# Patient Record
Sex: Male | Born: 1990 | Race: White | Hispanic: No | Marital: Single | Smoking: Current every day smoker
Health system: Southern US, Community
[De-identification: ages and names within clinical notes are randomized; demographics above are authoritative.]

## PROBLEM LIST (undated history)

## (undated) DIAGNOSIS — F1910 Other psychoactive substance abuse, uncomplicated: Secondary | ICD-10-CM

## (undated) DIAGNOSIS — G039 Meningitis, unspecified: Secondary | ICD-10-CM

## (undated) HISTORY — PX: LUMBAR PUNCTURE: SHX1985

---
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
## 2011-11-22 ENCOUNTER — Encounter (HOSPITAL_COMMUNITY): Payer: Self-pay | Admitting: *Deleted

## 2011-11-22 ENCOUNTER — Emergency Department (HOSPITAL_COMMUNITY)
Admission: EM | Admit: 2011-11-22 | Discharge: 2011-11-22 | Disposition: A | Discharge status: Home / Self Care | Payer: Self-pay | Attending: Emergency Medicine | Admitting: Emergency Medicine

## 2011-11-22 DIAGNOSIS — F17200 Nicotine dependence, unspecified, uncomplicated: Secondary | ICD-10-CM | POA: Insufficient documentation

## 2011-11-22 DIAGNOSIS — T622X1A Toxic effect of other ingested (parts of) plant(s), accidental (unintentional), initial encounter: Secondary | ICD-10-CM | POA: Insufficient documentation

## 2011-11-22 DIAGNOSIS — Z8661 Personal history of infections of the central nervous system: Secondary | ICD-10-CM | POA: Insufficient documentation

## 2011-11-22 DIAGNOSIS — L255 Unspecified contact dermatitis due to plants, except food: Secondary | ICD-10-CM | POA: Insufficient documentation

## 2011-11-22 DIAGNOSIS — L237 Allergic contact dermatitis due to plants, except food: Secondary | ICD-10-CM

## 2011-11-22 MED ORDER — HYDROXYZINE HCL 25 MG PO TABS
50.0000 mg | ORAL_TABLET | Freq: Once | ORAL | Status: AC
  Administered 2011-11-22: 50 mg via ORAL
  Filled 2011-11-22: qty 2

## 2011-11-22 MED ORDER — METHYLPREDNISOLONE SODIUM SUCC 125 MG IJ SOLR
125.0000 mg | Freq: Once | INTRAMUSCULAR | Status: AC
  Administered 2011-11-22: 125 mg via INTRAMUSCULAR
  Filled 2011-11-22: qty 2

## 2011-11-22 MED ORDER — PREDNISONE 10 MG PO TABS: ORAL_TABLET | ORAL | Status: DC

## 2011-11-22 MED ORDER — HYDROXYZINE PAMOATE 25 MG PO CAPS: ORAL_CAPSULE | ORAL | Status: DC

## 2011-11-22 NOTE — ED Notes (Signed)
Poison ivy rash over body

## 2011-11-22 NOTE — ED Provider Notes (Signed)
History     CSN: [id number]  Arrival date & time 11/22/11  1711   First MD Initiated Contact with Patient 11/22/11 1808      Chief Complaint  Patient presents with  . Rash    (Consider location/radiation/quality/duration/timing/severity/associated sxs/prior treatment) HPI Comments: Patient states that on yesterday July 24 as well as July 23 he was exposed to poison ivy. Today he has rash with some blistering on the right side of the face, the neck, both arms, and between the fingers on the left hand. There is also a small area of lesion on the abdomen. The patient complains of severe itching. He's not had fever or chills. He has not had any nausea or vomiting. He presents at this time for assistance with the itching in the problem. He has tried Benadryl but this is not helping.  The history is provided by the patient.    Past Medical History  Diagnosis Date  . Meningitis     History reviewed. No pertinent past surgical history.  No family history on file.  History  Substance Use Topics  . Smoking status: Current Everyday Smoker  . Smokeless tobacco: Not on file  . Alcohol Use: No      Review of Systems  Constitutional: Negative for activity change.       All ROS Neg except as noted in HPI  HENT: Negative for nosebleeds and neck pain.   Eyes: Negative for photophobia and discharge.  Respiratory: Negative for cough, shortness of breath and wheezing.   Cardiovascular: Negative for chest pain and palpitations.  Gastrointestinal: Negative for abdominal pain and blood in stool.  Genitourinary: Negative for dysuria, frequency and hematuria.  Musculoskeletal: Negative for back pain and arthralgias.  Skin: Positive for rash.  Neurological: Negative for dizziness, seizures and speech difficulty.  Psychiatric/Behavioral: Negative for hallucinations and confusion.    Allergies  Poison sumac extract  Home Medications   Current Outpatient Rx  Name Route Sig Dispense  Refill  . CALAMINE EX LOTN Topical Apply 1 application topically as needed. For poison ivy/oak exposure    . DIPHENHYDRAMINE HCL 25 MG PO TABS Oral Take 50 mg by mouth once as needed. For allergic reaction    . HYDROXYZINE PAMOATE 25 MG PO CAPS  1 po q6h prn itching. 20 capsule 0  . PREDNISONE 10 MG PO TABS  5,4,3,2,1 - take with food 15 tablet 0    BP 126/81  Pulse 81  Temp 97.5 F (36.4 C) (Oral)  Resp 20  Ht 5\' 5"  (1.651 m)  Wt 145 lb (65.772 kg)  BMI 24.13 kg/m2  SpO2 100%  Physical Exam  Nursing note and vitals reviewed. Constitutional: He is oriented to person, place, and time. He appears well-developed and well-nourished.  Non-toxic appearance.  HENT:  Head: Normocephalic.  Right Ear: Tympanic membrane and external ear normal.  Left Ear: Tympanic membrane and external ear normal.  Eyes: EOM and lids are normal. Pupils are equal, round, and reactive to light.  Neck: Normal range of motion. Neck supple. Carotid bruit is not present.  Cardiovascular: Normal rate, regular rhythm, normal heart sounds, intact distal pulses and normal pulses.   Pulmonary/Chest: Breath sounds normal. No respiratory distress.  Abdominal: Soft. Bowel sounds are normal. There is no tenderness. There is no guarding.  Musculoskeletal: Normal range of motion.  Lymphadenopathy:       Head (right side): No submandibular adenopathy present.       Head (left side): No submandibular adenopathy present.  He has no cervical adenopathy.  Neurological: He is alert and oriented to person, place, and time. He has normal strength. No cranial nerve deficit or sensory deficit.  Skin: Skin is warm and dry. Rash noted.       Multiple lesions with blisters and a red base on the neck, both arms, abdomen, and 3 lesions on the side of the right face. No red streaking appreciated.  Psychiatric: He has a normal mood and affect. His speech is normal.    ED Course  Procedures (including critical care time)  Labs  Reviewed - No data to display No results found. Pulse oximetry 100% on room air. Within normal limits by my interpretation.  1. Contact dermatitis due to poison ivy       MDM  I have reviewed nursing notes, vital signs, and all appropriate lab and imaging results for this patient. Examination is consistent with contact dermatitis patient states that he was exposed to poison ivy or poison sumac. Patient given an injection of Solu-Medrol in the emergency department. Prescription for Vistaril 25 mg one every 6 hours as needed for itching #20, and prednisone taper #15 given to the patient.      Kathie Dike, [place] 11/22/11 [phone]

## 2011-11-23 NOTE — ED Provider Notes (Signed)
Medical screening examination/treatment/procedure(s) were performed by non-physician practitioner and as supervising physician I was immediately available for consultation/collaboration.   Jearline Hirschhorn III, MD 11/23/11 1627 

## 2014-04-11 ENCOUNTER — Emergency Department (HOSPITAL_COMMUNITY)
Admission: EM | Admit: 2014-04-11 | Discharge: 2014-04-11 | Disposition: A | Discharge status: Home / Self Care | Payer: Self-pay | Attending: Emergency Medicine | Admitting: Emergency Medicine

## 2014-04-11 ENCOUNTER — Encounter (HOSPITAL_COMMUNITY): Payer: Self-pay | Admitting: Emergency Medicine

## 2014-04-11 ENCOUNTER — Emergency Department (HOSPITAL_COMMUNITY): Payer: Self-pay

## 2014-04-11 DIAGNOSIS — R059 Cough, unspecified: Secondary | ICD-10-CM

## 2014-04-11 DIAGNOSIS — J40 Bronchitis, not specified as acute or chronic: Secondary | ICD-10-CM | POA: Insufficient documentation

## 2014-04-11 DIAGNOSIS — Z8661 Personal history of infections of the central nervous system: Secondary | ICD-10-CM | POA: Insufficient documentation

## 2014-04-11 DIAGNOSIS — Z720 Tobacco use: Secondary | ICD-10-CM | POA: Insufficient documentation

## 2014-04-11 DIAGNOSIS — R05 Cough: Secondary | ICD-10-CM

## 2014-04-11 MED ORDER — PREDNISONE 10 MG PO TABS
20.0000 mg | ORAL_TABLET | Freq: Two times a day (BID) | ORAL | Status: DC
Start: 2014-04-11 — End: 2014-12-04

## 2014-04-11 MED ORDER — ALBUTEROL SULFATE HFA 108 (90 BASE) MCG/ACT IN AERS
1.0000 | INHALATION_SPRAY | RESPIRATORY_TRACT | Status: DC | PRN
  Administered 2014-04-11: 2 via RESPIRATORY_TRACT
  Filled 2014-04-11: qty 6.7

## 2014-04-11 MED ORDER — BENZONATATE 100 MG PO CAPS: 100.0000 mg | ORAL_CAPSULE | Freq: Three times a day (TID) | ORAL | Status: DC

## 2014-04-11 NOTE — ED Provider Notes (Signed)
CSN: [id number]     Arrival date & time 04/11/14  0955 History  This chart was scribed for non-physician practitioner, Kerrie BuffaloHope Neese, FNP,working with Vida RollerBrian D Miller, MD, by Karle PlumberJennifer Tensley, ED Scribe. This patient was seen in room APFT22/APFT22 and the patient's care was started at 12:02 PM.  Chief Complaint  Patient presents with  . Cough   Patient is a 23 y.o. male presenting with cough. The history is provided by the patient. No language interpreter was used.  Cough Associated symptoms: sore throat   Associated symptoms: no chills, no ear pain and no fever     HPI Comments:  Barry Henderson is a 23 y.o. male who presents to the Emergency Department complaining of productive cough of green mucous with a "chunk" of blood and congestion that began six days ago. Pt reports associated fever and chills that have now resolved. Pt states he had an episode of post-tussive emesis yesterday morning. He reports taking two days worth of PCN, Benadryl and Robitussin with significant relief. He denies worsening or alleviating factors. Denies current fever, chills, nausea, vomiting or otalgia. Pt normally smokes cigarettes 1 PPD. PMH of meningitis.  Past Medical History  Diagnosis Date  . Meningitis    History reviewed. No pertinent past surgical history. Family History  Problem Relation Age of Onset  . Cancer Other   . Diabetes Other    History  Substance Use Topics  . Smoking status: Current Every Day Smoker -- 1.00 packs/day for 4 years    Types: Cigarettes  . Smokeless tobacco: Never Used  . Alcohol Use: No    Review of Systems  Constitutional: Negative for fever and chills.  HENT: Positive for congestion and sore throat. Negative for ear pain.   Respiratory: Positive for cough.   Gastrointestinal: Negative for nausea and vomiting.  All other systems reviewed and are negative.   Allergies  Poison sumac extract  Home Medications   Prior to Admission medications   Medication Sig  Start Date End Date Taking? Authorizing Provider  diphenhydrAMINE (BENADRYL) 25 MG tablet Take 50 mg by mouth once as needed for allergies. For allergic reaction   Yes Historical Provider, MD  guaifenesin (ROBITUSSIN) 100 MG/5ML syrup Take 120 mLs by mouth daily as needed for cough.    Yes Historical Provider, MD  benzonatate (TESSALON) 100 MG capsule Take 1 capsule (100 mg total) by mouth every 8 (eight) hours. 04/11/14   Hope Orlene OchM Neese, NP  predniSONE (DELTASONE) 10 MG tablet Take 2 tablets (20 mg total) by mouth 2 (two) times daily with a meal. 04/11/14   Hope Orlene OchM Neese, NP   Triage Vitals: BP 119/84 mmHg  Pulse 60  Temp(Src) 98.6 F (37 C) (Oral)  Resp 16  Ht 5\' 6"  (1.676 m)  Wt 150 lb (68.04 kg)  BMI 24.22 kg/m2  SpO2 100% Physical Exam  Constitutional: He is oriented to person, place, and time. He appears well-developed and well-nourished.  HENT:  Head: Atraumatic.  Right Ear: Tympanic membrane normal.  Left Ear: Tympanic membrane normal.  Mouth/Throat: Uvula is midline, oropharynx is clear and moist and mucous membranes are normal. No oropharyngeal exudate, posterior oropharyngeal edema, posterior oropharyngeal erythema or tonsillar abscesses.  Eyes: Conjunctivae and EOM are normal. Pupils are equal, round, and reactive to light.  Neck: Normal range of motion. Neck supple.  Cardiovascular: Normal rate and regular rhythm.   No murmur heard. Pulmonary/Chest: Effort normal. No respiratory distress. He has wheezes. He has no rales.  Abdominal: Soft. Bowel sounds are normal. There is no tenderness.  Musculoskeletal: Normal range of motion.  Lymphadenopathy:    He has no cervical adenopathy.  Neurological: He is alert and oriented to person, place, and time. No cranial nerve deficit.  Skin: Skin is warm and dry.  Psychiatric: He has a normal mood and affect. His behavior is normal.  Nursing note and vitals reviewed.   ED Course  Procedures (including critical care time) DIAGNOSTIC  STUDIES: Oxygen Saturation is 100% on RA, normal by my interpretation.   COORDINATION OF CARE: 12:08 PM- Will prescribe Benzonatate, Prednisone and Albuterol MDI. Pt verbalizes understanding and agrees to plan. Imaging Review Dg Chest 2 View  04/11/2014   CLINICAL DATA:  Cough, congestion, coughed up blood this morning  EXAM: CHEST - 2 VIEW  COMPARISON:  None available  FINDINGS: Lungs are clear. Heart size and mediastinal contours are within normal limits. No effusion.  No pneumothorax. Visualized skeletal structures are unremarkable.  IMPRESSION: No acute cardiopulmonary disease.   Electronically Signed   By: Oley Balmaniel  Hassell M.D.   On: 04/11/2014 11:21    MDM  23 y.o. male with congestion and cough for the past few days. Stable for discharge without respiratory distress and no pneumonia. O2 SAT 99% on R/A. Will treat for bronchitis and encourage to stop smoking. Discussed with the patient clinical and x-ray findings and plan of care. All questioned fully answered. He will return if any problems arise.    Medication List    STOP taking these medications        hydrOXYzine 25 MG capsule  Commonly known as:  VISTARIL      TAKE these medications        benzonatate 100 MG capsule  Commonly known as:  TESSALON  Take 1 capsule (100 mg total) by mouth every 8 (eight) hours.     predniSONE 10 MG tablet  Commonly known as:  DELTASONE  Take 2 tablets (20 mg total) by mouth 2 (two) times daily with a meal.      ASK your doctor about these medications        diphenhydrAMINE 25 MG tablet  Commonly known as:  BENADRYL  Take 50 mg by mouth once as needed for allergies. For allergic reaction     guaifenesin 100 MG/5ML syrup  Commonly known as:  ROBITUSSIN  Take 120 mLs by mouth daily as needed for cough.        Final diagnoses:  Bronchitis   I personally performed the services described in this documentation, which was scribed in my presence. The recorded information has been  reviewed and is accurate.    [address]Hope HoltM Neese, NP 04/12/14 1728  Vida RollerBrian D Miller, MD 04/13/14 [phone]

## 2014-04-11 NOTE — ED Notes (Signed)
RT paged for inhaler teaching/spacer

## 2014-04-11 NOTE — ED Notes (Signed)
Patient c/o cough and congestion since Monday. Per patient started with fever, cough, and generalized pain. Denies any fevers, or pain but reports constant cough with sore throat. Patient reports cough is productive with thick green sputum and this morning some blood was noted. Reports using robitussin with no relief.

## 2014-12-04 ENCOUNTER — Encounter (HOSPITAL_COMMUNITY): Payer: Self-pay

## 2014-12-04 ENCOUNTER — Emergency Department (HOSPITAL_COMMUNITY)
Admission: EM | Admit: 2014-12-04 | Discharge: 2014-12-04 | Disposition: A | Discharge status: Home / Self Care | Payer: 59 | Attending: Emergency Medicine | Admitting: Emergency Medicine

## 2014-12-04 DIAGNOSIS — F17210 Nicotine dependence, cigarettes, uncomplicated: Secondary | ICD-10-CM | POA: Insufficient documentation

## 2014-12-04 DIAGNOSIS — J36 Peritonsillar abscess: Secondary | ICD-10-CM | POA: Diagnosis present

## 2014-12-04 MED ORDER — HYDROCODONE-ACETAMINOPHEN 5-325 MG PO TABS: 1.0000 | ORAL_TABLET | Freq: Four times a day (QID) | ORAL | Status: DC | PRN

## 2014-12-04 MED ORDER — DEXAMETHASONE SODIUM PHOSPHATE 4 MG/ML IJ SOLN
10.0000 mg | Freq: Once | INTRAMUSCULAR | Status: AC
  Administered 2014-12-04: 10 mg via INTRAVENOUS
  Filled 2014-12-04: qty 3

## 2014-12-04 MED ORDER — CLINDAMYCIN HCL 300 MG PO CAPS: 300.0000 mg | ORAL_CAPSULE | Freq: Four times a day (QID) | ORAL | Status: DC

## 2014-12-04 MED ORDER — HYDROCODONE-ACETAMINOPHEN 7.5-325 MG/15ML PO SOLN
10.0000 mL | Freq: Once | ORAL | Status: AC
Start: 2014-12-04 — End: 2014-12-04
  Administered 2014-12-04: 10 mL via ORAL
  Filled 2014-12-04: qty 15

## 2014-12-04 MED ORDER — CLINDAMYCIN PHOSPHATE 600 MG/50ML IV SOLN
600.0000 mg | Freq: Once | INTRAVENOUS | Status: AC
  Administered 2014-12-04: 600 mg via INTRAVENOUS
  Filled 2014-12-04: qty 50

## 2014-12-04 NOTE — Consult Note (Signed)
Reason for Consult:Right peritonsillar abscess Referring Physician: ER  Barry Henderson is an 24 y.o. male.  HPI: 24 year old male with 4-5 day history of sore throat treated initially mid-week with amoxicillin and ibuprofen.  Symptoms have not improved and have worsened with right-sided throat pain and now difficulty swallowing, even his secretions.  He came to [facility] ER where a right-sided peritonsillar abscess was diagnosed and he was given IV clindamycin and dexamethasone.  He was transferred to [facility] for further management.  Medications have been helpful and symptoms are much improved at present.  Past Medical History  Diagnosis Date  . Meningitis     Past Surgical History  Procedure Laterality Date  . Lumbar puncture      Family History  Problem Relation Age of Onset  . Cancer Other   . Diabetes Other     Social History:  reports that he has been smoking Cigarettes.  He has a 4 pack-year smoking history. He has never used smokeless tobacco. He reports that he uses illicit drugs (Marijuana). He reports that he does not drink alcohol.  Allergies:  Allergies  Allergen Reactions  . Poison Sumac Extract Itching, Swelling and Rash    Medications: I have reviewed the patient's current medications.  No results found for this or any previous visit (from the past 48 hour(s)).  No results found.  Review of Systems  HENT: Positive for ear pain and sore throat.   All other systems reviewed and are negative.  Blood pressure 111/73, pulse 65, temperature 97.9 F (36.6 C), temperature source Oral, resp. rate 18, height  (1.651 m), weight 68.04 kg (150 lb), SpO2 98 %. Physical Exam  Constitutional: He is oriented to person, place, and time. He appears well-developed and well-nourished. No distress.  HENT:  Head: Normocephalic and atraumatic.  Right Ear: External ear normal.  Left Ear: External ear normal.  Nose: Nose normal.  Right peritonsillar fullness, uvula  slightly to left.  Hot potato voice.  Eyes: Conjunctivae and EOM are normal. Pupils are equal, round, and reactive to light.  Neck: Normal range of motion. Neck supple.  Right zone 2 tenderness.  Cardiovascular: Normal rate.   Respiratory: Effort normal.  Musculoskeletal: Normal range of motion.  Neurological: He is alert and oriented to person, place, and time.  Skin: Skin is warm and dry.  Psychiatric: He has a normal mood and affect. His behavior is normal. Judgment and thought content normal.    Assessment/Plan: Right peritonsillar abscess The right peritonsillar abscess was drained in the ER.  See procedure note.  He can be discharged on oral clindamycin.  Plenty of hydration.  Follow-up in two weeks.  Barry Culverhouse 12/04/2014, 2:00 PM

## 2014-12-04 NOTE — ED Notes (Signed)
Dr Jenne Pane in w/pt.

## 2014-12-04 NOTE — Procedures (Signed)
Preop diagnosis: Right peritonsillar abscess Postop diagnosis: same Procedure: Incision and drainage of right peritonsillar abscess Surgeon: Jenne Pane Anesth: Local Compl: None Description: After discussing risks, benefits, and alternatives, the patient was positioned in a seated position and the right oropharynx was sprayed with topical cetacaine spray twice.  The area was then injected with 1% lidocaine with 1:100,000 epinephrine.  A horizontal incision was made above the right tonsil and yellow pus drained immediately.  The abscess cavity was probed with a hemostat and pus drained fully.  He was returned to nursing care in stable condition.

## 2014-12-04 NOTE — ED Provider Notes (Signed)
CSN: [id number]     Arrival date & time 12/04/14  0935 History   First MD Initiated Contact with Patient 12/04/14 1027     Chief Complaint  Patient presents with  . Sore Throat     (Consider location/radiation/quality/duration/timing/severity/associated sxs/prior Treatment) HPI   Barry Henderson is a 24 y.o. male who presents to the Emergency Department complaining of sore throat for four days. He was seen at another facility Wednesday and diagnosed with tonsillitis, given ibuprofen and amoxicillin.  He has been taking his medications regularly without improvement of his symptoms.  Today, she states the pain is much worse and only involves the right side of his throat.  Having difficulty handling his own secretions at times and states he has been unable to eat solid foods for several days.  He denies fever, chills, fatigue, vomiting or rash, abdominal pain.     Past Medical History  Diagnosis Date  . Meningitis    Past Surgical History  Procedure Laterality Date  . Lumbar puncture     Family History  Problem Relation Age of Onset  . Cancer Other   . Diabetes Other    History  Substance Use Topics  . Smoking status: Current Every Day Smoker -- 1.00 packs/day for 4 years    Types: Cigarettes  . Smokeless tobacco: Never Used  . Alcohol Use: No    Review of Systems  Constitutional: Positive for appetite change. Negative for fever, chills and activity change.  HENT: Positive for sore throat, trouble swallowing and voice change. Negative for congestion, ear pain and facial swelling.   Eyes: Negative for pain and visual disturbance.  Respiratory: Negative for cough and shortness of breath.   Gastrointestinal: Negative for nausea, vomiting and abdominal pain.  Musculoskeletal: Negative for arthralgias, neck pain and neck stiffness.  Skin: Negative for color change and rash.  Neurological: Negative for dizziness, facial asymmetry, speech difficulty, numbness and headaches.   Hematological: Negative for adenopathy.  All other systems reviewed and are negative.     Allergies  Poison sumac extract  Home Medications   Prior to Admission medications   Medication Sig Start Date End Date Taking? Authorizing Provider  amoxicillin (AMOXIL) 500 MG capsule Take 500 mg by mouth 2 (two) times daily. Started on 12/01/14   Yes Historical Provider, MD  benzonatate (TESSALON) 100 MG capsule Take 1 capsule (100 mg total) by mouth every 8 (eight) hours. Patient not taking: Reported on 12/04/2014 04/11/14   Janne Napoleon, NP  diphenhydrAMINE (BENADRYL) 25 MG tablet Take 50 mg by mouth once as needed for allergies. For allergic reaction    Historical Provider, MD  predniSONE (DELTASONE) 10 MG tablet Take 2 tablets (20 mg total) by mouth 2 (two) times daily with a meal. Patient not taking: Reported on 12/04/2014 04/11/14   Janne Napoleon, NP   BP 127/84 mmHg  Pulse 72  Temp(Src) 98.1 F (36.7 C) (Oral)  Resp 18  Ht 5\' 5"  (1.651 m)  Wt 150 lb (68.04 kg)  BMI 24.96 kg/m2  SpO2 99% Physical Exam  Constitutional: He is oriented to person, place, and time. He appears well-developed and well-nourished. No distress.  HENT:  Head: Normocephalic and atraumatic.  Right Ear: Tympanic membrane and ear canal normal.  Left Ear: Tympanic membrane and ear canal normal.  Mouth/Throat: Uvula is midline and mucous membranes are normal. No trismus in the jaw. No uvula swelling. Posterior oropharyngeal edema, posterior oropharyngeal erythema and tonsillar abscesses present.  Significant erythema  and bulging of the right tonsil.  Uvula is midline.  Pt is handling own secretions at present.    Neck: Normal range of motion. Neck supple. No Kernig's sign noted.  Cardiovascular: Normal rate, regular rhythm and normal heart sounds.   Pulmonary/Chest: Effort normal and breath sounds normal. No respiratory distress.  Abdominal: Soft. He exhibits no distension. There is no splenomegaly. There is no  tenderness. There is no rebound and no guarding.  Musculoskeletal: Normal range of motion.  Lymphadenopathy:    He has cervical adenopathy.       Right cervical: Superficial cervical adenopathy present.  Neurological: He is alert and oriented to person, place, and time. He exhibits normal muscle tone. Coordination normal.  Skin: Skin is warm and dry.  Nursing note and vitals reviewed.   ED Course  Procedures (including critical care time) Labs Review Labs Reviewed - No data to display  Imaging Review No results found.   EKG Interpretation None      MDM   Final diagnoses:  Peritonsillar abscess  pt is non-toxic appearing.  Vitals stable.  Right side peritonsillar abscess.  Handles secretions well.    1050  Consulted on call ENT, Dr. Jenne Pane. Agrees to see pt in ED at [facility] for further management.   IV decadron and clindamycin given also with single dose of Hycet.     Pt agrees to go to [facility] ED by POV to see Dr. Jenne Pane upon arrival.   1148  Spoke with Dr. Madilyn Hook at [facility], Neila Gear B physician, notified of pt's pending arrival and to contact Dr. Jenne Pane on arrival.    Pauline Aus, PA-C 12/04/14 1154  Vanetta Mulders, MD 12/04/14 1213

## 2014-12-04 NOTE — ED Provider Notes (Signed)
Medical screening exam:  Patient transferred from [facility] emergency department for further treatment of peritonsillar abscess. Patient appears comfortable upon arrival to the ER. Examination reveals large swollen peritonsillar region on the right consistent with abscess. Patient in no distress, no airway compromise. Awaiting ENT arrival for definitive treatment.  Gilda Crease, MD 12/04/14 1310

## 2014-12-04 NOTE — ED Notes (Signed)
Dr Pollina in w/pt. 

## 2014-12-04 NOTE — ED Notes (Signed)
Pt signed d/c paperwork - waiting for father to arrive. Pt placed yaunker on bedside table - states no longer needs. Pt able to speak in full sentences and is drinking water given w/o difficulty.

## 2014-12-04 NOTE — ED Notes (Signed)
Pt c/o sore throat x 5 days.  Was diagnosed with tonsilitis at [facility] Wednesday and has been taking amoxicillin without any change.  Pt writing things down because it hurts to talk.

## 2014-12-04 NOTE — Discharge Instructions (Signed)
Peritonsillar Abscess °Peritonsillar abscess is a collection of yellowish white fluid (pus) in the back of the throat. This fluid forms behind the tonsils. The treatment is most often drainage. This is done by: °· Putting a needle into the abscess. °· Cutting and draining the abscess. °HOME CARE °· If your abscess was drained today: °¨ Mix 1 teaspoon of salt in 8 ounces of warm water for gargling. °¨ Gargle the warm salt water. °¨ Gargle 4 times per day or as needed for comfort. Do not swallow this mixture. °· Rest in bed as needed. °· Return to normal activity as soon as you can. °· Apply cold to your neck for pain relief. °¨ Put ice in a plastic bag. °¨ Place a towel between your skin and the bag. °¨ Leave the ice on for 15-20 minutes at a time, 03-04 times a day. °· Eat a soft or liquid diet. Drink cold fluids. Cold fluids will soothe and take puffiness (swelling) down. °· Only take medicine as told by your doctor. °· Take all medicine as told. °GET HELP RIGHT AWAY IF:  °· You are coughing up or throwing up (vomiting) blood. °· Your throat pain is severe and pain medicine does not help it. °· You have trouble talking or breathing. °· You have trouble swallowing or eating. °· You find it easier to breathe while leaning forward. °· You have pain that gets worse. °· You have pain, puffiness, redness, or drainage in your throat. °· You have a fever. °· You become dizzy, have a headache, have low energy, or feel sick. °· You have signs of body fluid loss (dehydration). This includes lightheadedness when standing, peeing (urinating) less, a fast heart rate, or dry mouth and nose. °· You start to drool. °MAKE SURE YOU: °· Understand these instructions. °· Will watch your condition. °· Will get help if you are not doing well or get worse. °Document Released: 04/04/2009 Document Revised: 07/09/2011 Document Reviewed: 04/04/2009 °Exit[employer]® Patient Information ©2015 [employer]. This information is not intended to replace  advice given to you by your health care provider. Make sure you discuss any questions you have with your health care provider. ° °

## 2015-11-22 ENCOUNTER — Emergency Department (HOSPITAL_COMMUNITY)
Admission: EM | Admit: 2015-11-22 | Discharge: 2015-11-22 | Disposition: A | Discharge status: Home / Self Care | Payer: BLUE CROSS/BLUE SHIELD | Attending: Emergency Medicine | Admitting: Emergency Medicine

## 2015-11-22 ENCOUNTER — Encounter (HOSPITAL_COMMUNITY): Payer: Self-pay | Admitting: Emergency Medicine

## 2015-11-22 DIAGNOSIS — Y999 Unspecified external cause status: Secondary | ICD-10-CM | POA: Diagnosis not present

## 2015-11-22 DIAGNOSIS — X58XXXA Exposure to other specified factors, initial encounter: Secondary | ICD-10-CM | POA: Diagnosis not present

## 2015-11-22 DIAGNOSIS — H5711 Ocular pain, right eye: Secondary | ICD-10-CM | POA: Diagnosis present

## 2015-11-22 DIAGNOSIS — S0501XA Injury of conjunctiva and corneal abrasion without foreign body, right eye, initial encounter: Secondary | ICD-10-CM

## 2015-11-22 DIAGNOSIS — Y9389 Activity, other specified: Secondary | ICD-10-CM | POA: Diagnosis not present

## 2015-11-22 DIAGNOSIS — Y929 Unspecified place or not applicable: Secondary | ICD-10-CM | POA: Diagnosis not present

## 2015-11-22 DIAGNOSIS — F17210 Nicotine dependence, cigarettes, uncomplicated: Secondary | ICD-10-CM | POA: Insufficient documentation

## 2015-11-22 MED ORDER — ERYTHROMYCIN 5 MG/GM OP OINT
TOPICAL_OINTMENT | Freq: Four times a day (QID) | OPHTHALMIC | Status: DC
  Administered 2015-11-22: 1 via OPHTHALMIC
  Filled 2015-11-22: qty 3.5

## 2015-11-22 MED ORDER — FLUORESCEIN SODIUM 1 MG OP STRP
1.0000 | ORAL_STRIP | Freq: Once | OPHTHALMIC | Status: AC
  Administered 2015-11-22: 1 via OPHTHALMIC

## 2015-11-22 MED ORDER — TETRACAINE HCL 0.5 % OP SOLN
2.0000 [drp] | Freq: Once | OPHTHALMIC | Status: AC
  Administered 2015-11-22: 2 [drp] via OPHTHALMIC

## 2015-11-22 MED ORDER — TETRACAINE HCL 0.5 % OP SOLN
OPHTHALMIC | Status: AC
  Administered 2015-11-22: 2 [drp] via OPHTHALMIC
  Filled 2015-11-22: qty 4

## 2015-11-22 MED ORDER — FLUORESCEIN SODIUM 1 MG OP STRP
ORAL_STRIP | OPHTHALMIC | Status: AC
  Administered 2015-11-22: 1 via OPHTHALMIC
  Filled 2015-11-22: qty 1

## 2015-11-22 NOTE — Discharge Instructions (Signed)
Please use antibiotics as directed for times daily, please use Tylenol or ibuprofen as needed for pain. Follow-up with ophthalmologist for further evaluation and management if symptoms persist beyond 3 days, return to the emergency room if any new or worsening signs or symptoms present.

## 2015-11-22 NOTE — ED Provider Notes (Signed)
[facility]-EMER[facility]ENCY DEPT Provider Note   CSN: [id number] Arrival date & time: 11/22/15  1009  First Provider Contact:  None       History   Chief Complaint Chief Complaint  Patient presents with  . Eye Drainage    HPI Barry Henderson is a 25 y.o. male.  HPI   25 year old male presents today with eye pain. Patient reports he was mowing lawn when a rock struck him in the eye. He reports immediate pain, redness, clear drainage. Patient denies any significant changes in his vision, notes some blurriness and light sensitivity. He denies any pain with extraocular movements. No medications prior to arrival, no surrounding soft tissue edema. Patient does not wear contact lenses  Past Medical History:  Diagnosis Date  . Meningitis     There are no active problems to display for this patient.   Past Surgical History:  Procedure Laterality Date  . LUMBAR PUNCTURE         Home Medications    Prior to Admission medications   Not on File    Family History Family History  Problem Relation Age of Onset  . Cancer Other   . Diabetes Other     Social History Social History  Substance Use Topics  . Smoking status: Current Every Day Smoker    Packs/day: 1.00    Years: 4.00    Types: Cigarettes  . Smokeless tobacco: Never Used  . Alcohol use No     Allergies   Poison sumac extract   Review of Systems Review of Systems  All other systems reviewed and are negative.    Physical Exam Updated Vital Signs BP 135/69 (BP Location: Left Arm)   Pulse 76   Temp 97.8 F (36.6 C) (Oral)   Resp 18   Ht  (1.651 m)   Wt 70.3 kg   SpO2 99%   BMI 25.79 kg/m   Physical Exam  Constitutional: He is oriented to person, place, and time. He appears well-developed and well-nourished. No distress.  HENT:  No peri-ocular swelling, redness, tenderness of warmth to touch  Eyes: Conjunctivae are normal. Pupils are equal, round, and reactive to light. Right eye exhibits no  discharge. Left eye exhibits no discharge. No scleral icterus.  General: Minor conjunctival injection, tearing, no ptosis  Visual acuity: Grossly normal Extraocular movements: normal ROM pain free Confrontational visual fields: equal Pupils: symmetrical, reactive to light, normal pupillary reflex Fluorescein: Small uptake several millimeters below the pupil left eye at the 6:00 position  Red reflex: normal    Neck: Normal range of motion. Neck supple. No JVD present. No tracheal deviation present. No thyromegaly present.  Pulmonary/Chest: Effort normal. No stridor.  Lymphadenopathy:    He has no cervical adenopathy.  Neurological: He is alert and oriented to person, place, and time.  Skin: Skin is warm and dry. He is not diaphoretic.  Psychiatric: He has a normal mood and affect. His behavior is normal. Judgment and thought content normal.     ED Treatments / Results  Labs (all labs ordered are listed, but only abnormal results are displayed) Labs Reviewed - No data to display  EKG  EKG Interpretation None       Radiology No results found.  Procedures Procedures (including critical care time)  Medications Ordered in ED Medications  erythromycin ophthalmic ointment (not administered)  fluorescein ophthalmic strip 1 strip (1 strip Right Eye Given 11/22/15 1200)  tetracaine (PONTOCAINE) 0.5 % ophthalmic solution 2 drop (2 drops  Right Eye Handoff 11/22/15 1147)     Initial Impression / Assessment and Plan / ED Course  I have reviewed the triage vital signs and the nursing notes.  Pertinent labs & imaging results that were available during my care of the patient were reviewed by me and considered in my medical decision making (see chart for details).  Clinical Course      Final Clinical Impressions(s) / ED Diagnoses   Final diagnoses:  Corneal abrasion, right, initial encounter   Labs:  Imaging:  Consults:  Therapeutics:Erythromycin  Discharge Meds:    Assessment/Plan:  25 year old male presents today with corneal abrasion. No other abnormalities noted on his exam, he was placed on erythromycin, given ophthalmology follow-up. Strict return precautions given, he verbalized understanding and agreement to today's plan had no further questions concerns at time of discharge      New Prescriptions New Prescriptions   No medications on file     Eyvonne Mechanic, PA-C 11/22/15 1231    Bethann Berkshire, MD 11/23/15 [phone]

## 2015-11-22 NOTE — ED Triage Notes (Signed)
PT states he felt something go into his right eye while cutting grass in his yard yesterday around 1000. PT states clear drainage and swelling to right eye noted. PT denies any visual changes.

## 2016-06-08 IMAGING — CR DG CHEST 2V
2 series · 2 of 2 positions shown · non-contrast
Comparison: None available

CLINICAL DATA: Cough, congestion, coughed up blood this morning

EXAM:
CHEST - 2 VIEW

[view not recorded (1 of 2)]
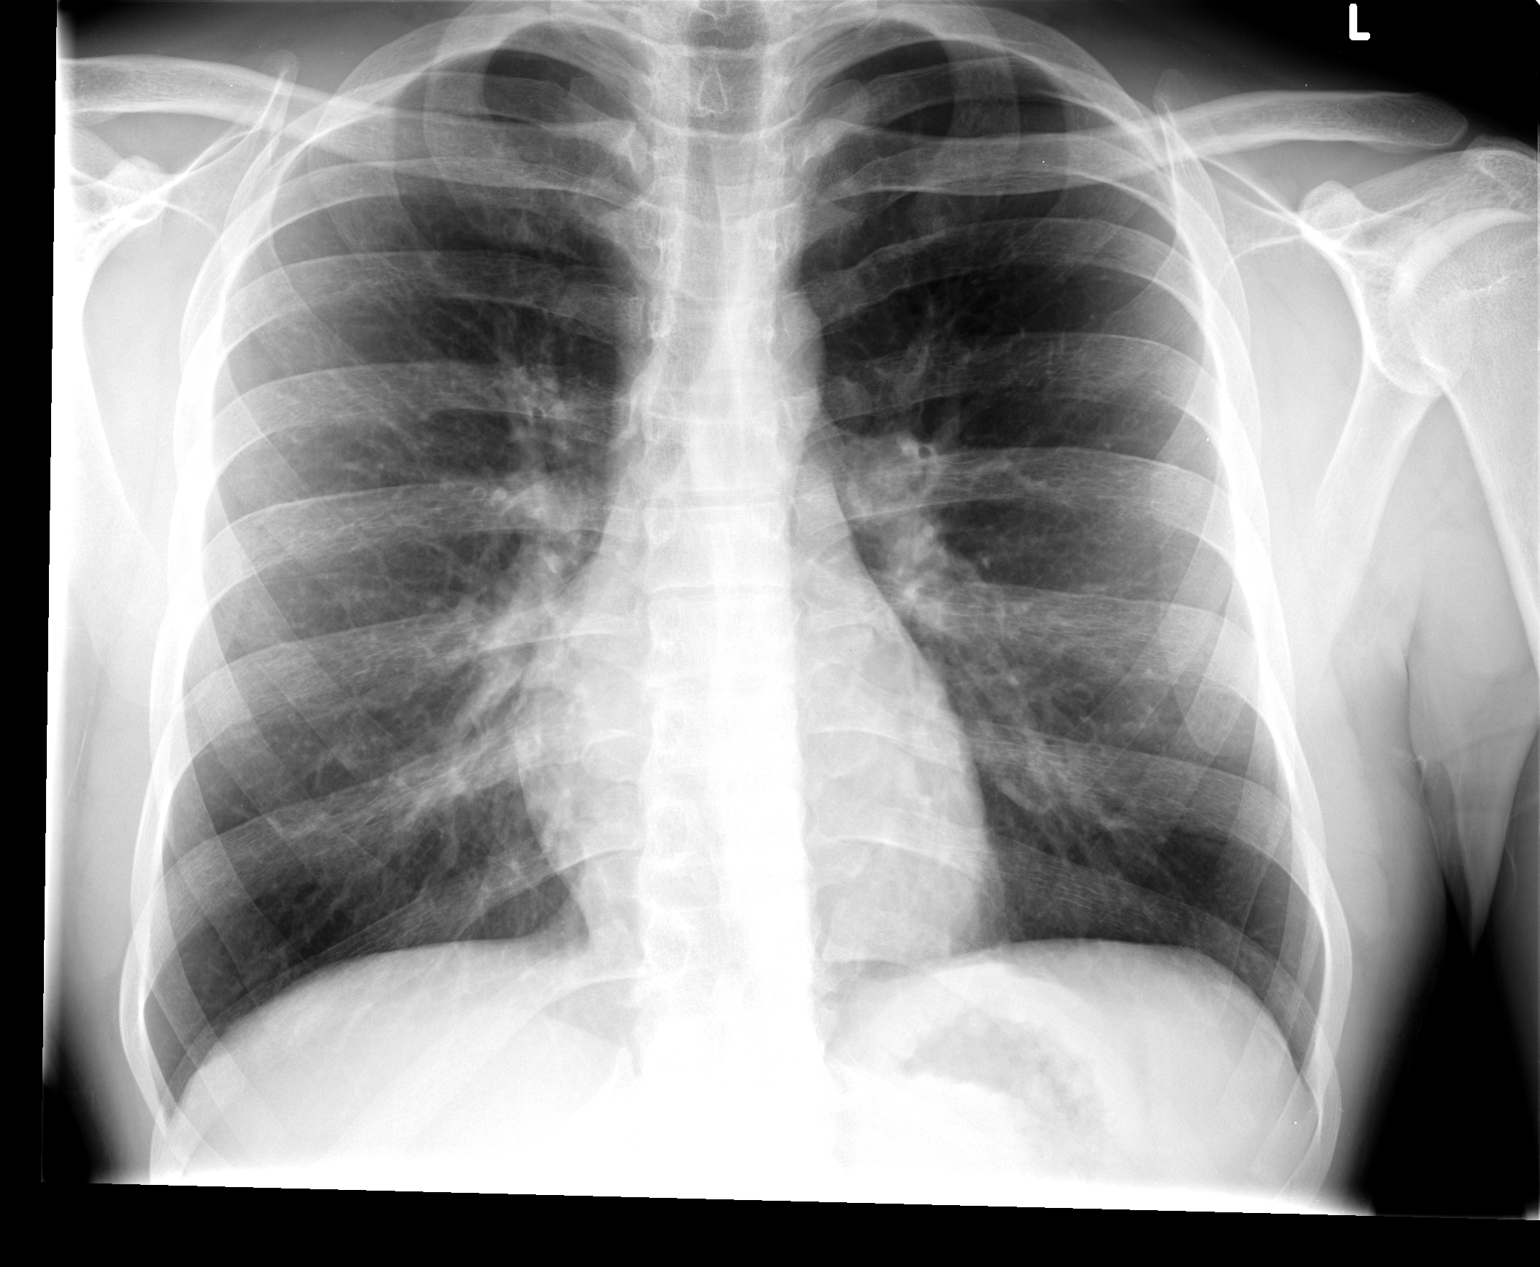

[view not recorded (2 of 2)]
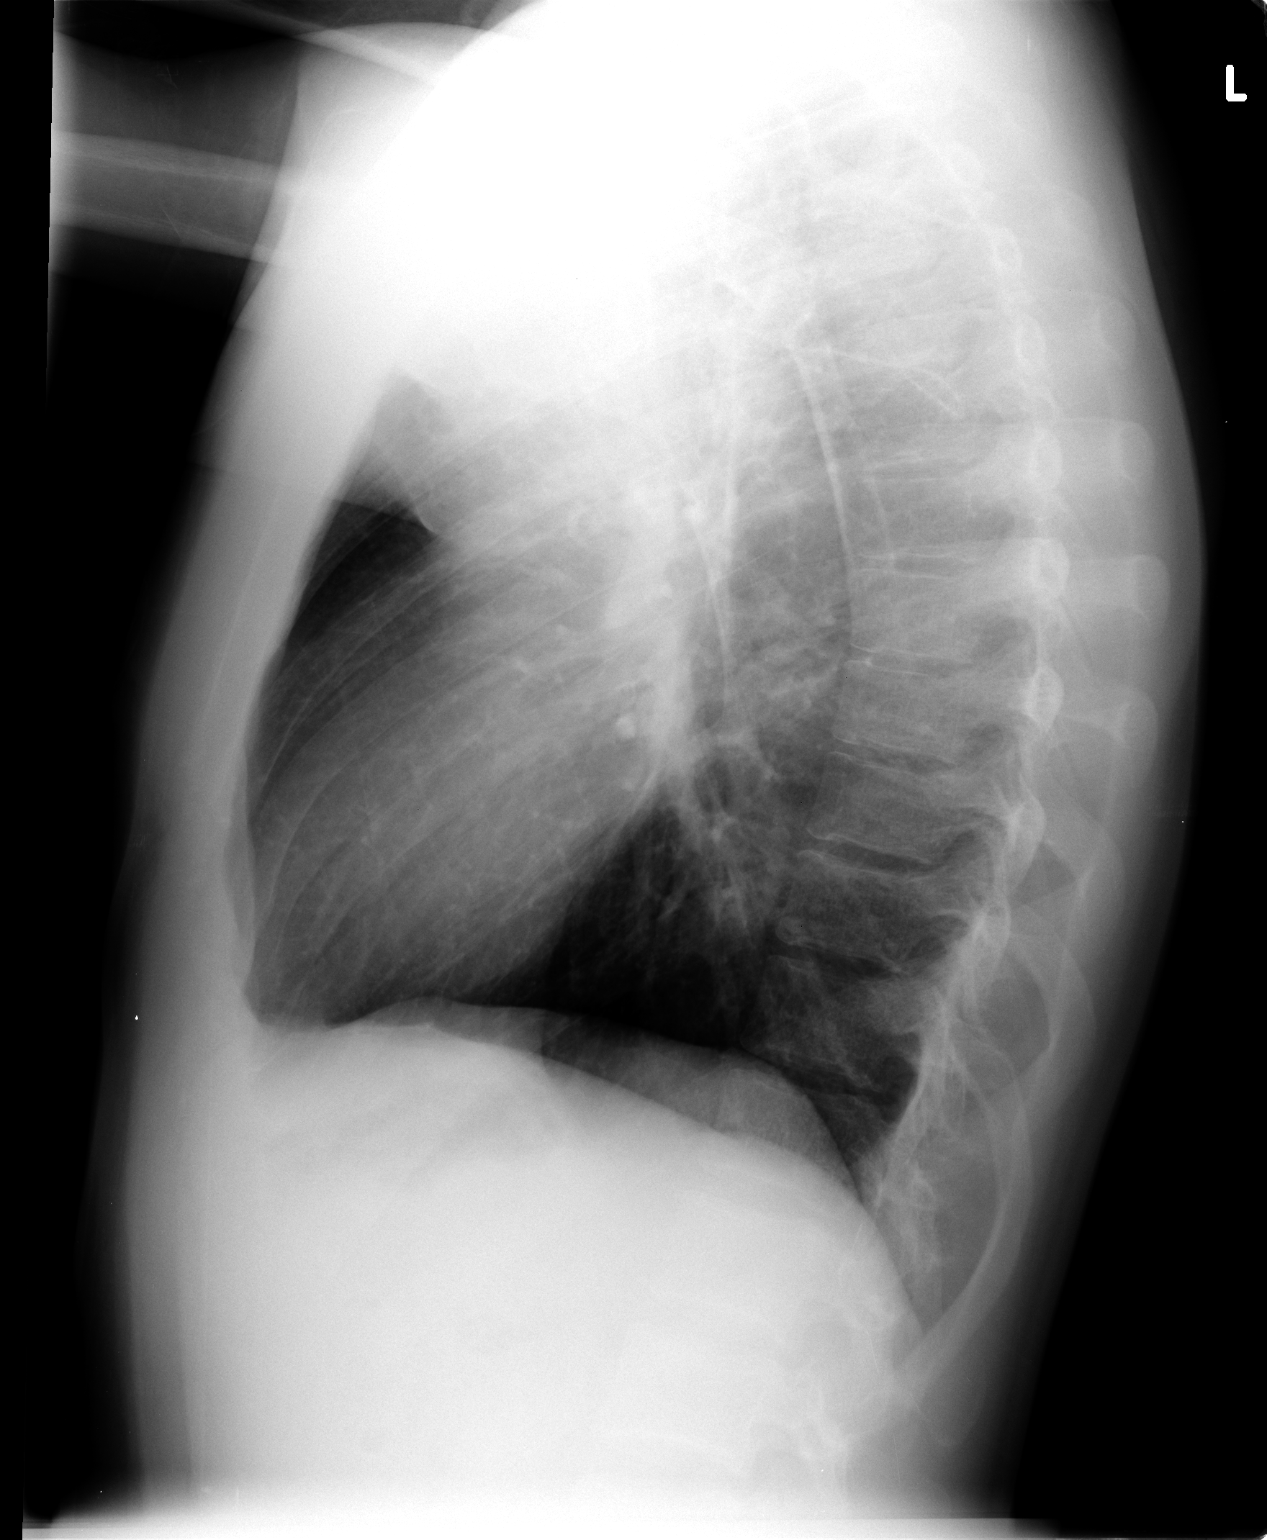

[2 of 2 positions shown; findings below may reference images not displayed]

FINDINGS: Lungs are clear. Heart size and mediastinal contours are within
normal limits.
No effusion.  No pneumothorax.
Visualized skeletal structures are unremarkable.
IMPRESSION: No acute cardiopulmonary disease.

## 2016-09-24 ENCOUNTER — Emergency Department (HOSPITAL_COMMUNITY)
Admission: EM | Admit: 2016-09-24 | Discharge: 2016-09-24 | Disposition: A | Discharge status: Home / Self Care | Payer: BLUE CROSS/BLUE SHIELD | Attending: Emergency Medicine | Admitting: Emergency Medicine

## 2016-09-24 ENCOUNTER — Encounter (HOSPITAL_COMMUNITY): Payer: Self-pay

## 2016-09-24 DIAGNOSIS — F17210 Nicotine dependence, cigarettes, uncomplicated: Secondary | ICD-10-CM | POA: Insufficient documentation

## 2016-09-24 DIAGNOSIS — J029 Acute pharyngitis, unspecified: Secondary | ICD-10-CM | POA: Diagnosis not present

## 2016-09-24 DIAGNOSIS — F1290 Cannabis use, unspecified, uncomplicated: Secondary | ICD-10-CM | POA: Diagnosis not present

## 2016-09-24 MED ORDER — DEXAMETHASONE SODIUM PHOSPHATE 10 MG/ML IJ SOLN
10.0000 mg | Freq: Once | INTRAMUSCULAR | Status: AC
  Administered 2016-09-24: 10 mg via INTRAMUSCULAR
  Filled 2016-09-24: qty 1

## 2016-09-24 MED ORDER — KETOROLAC TROMETHAMINE 60 MG/2ML IM SOLN
60.0000 mg | Freq: Once | INTRAMUSCULAR | Status: AC
  Administered 2016-09-24: 60 mg via INTRAMUSCULAR
  Filled 2016-09-24: qty 2

## 2016-09-24 NOTE — Discharge Instructions (Signed)
Take your antibiotics as directed by previous physician. Take ibuprofen 600 mg and tylenol 100 mg every 6 hrs as needed for pain and fevers.  If you were given medicines take as directed.  If you are on coumadin or contraceptives realize their levels and effectiveness is altered by many different medicines.  If you have any reaction (rash, tongues swelling, other) to the medicines stop taking and see a physician.    If your blood pressure was elevated in the ER make sure you follow up for management with a primary doctor or return for chest pain, shortness of breath or stroke symptoms.  Please follow up as directed and return to the ER or see a physician for new or worsening symptoms.  Thank you. Vitals:   09/24/16 0952 09/24/16 0958  BP:  127/72  Pulse:  80  Resp:  15  Temp:  98.2 F (36.8 C)  TempSrc:  Oral  SpO2:  99%  Weight: 68 kg (150 lb)   Height: 5\' 5"  (1.651 m)

## 2016-09-24 NOTE — ED Triage Notes (Signed)
Pt. Was seen at [facility] yesterday and was diagnosed with Tonsilitis. States he was given Antibiotics and a shot. Woke up this morning and states the pain is unbearable. Right tonsil is extremely swollen. States this pain and swelling started 4 days ago.

## 2016-09-24 NOTE — ED Provider Notes (Signed)
[facility]-EM[facility] DEPT Provider Note   CSN: [id number] Arrival date & time: 09/24/16  4098  By signing my name below, I, Barry Henderson, attest that this documentation has been prepared under the direction and in the presence of Blane Ohara, MD. Electronically Signed: Deland Henderson, ED Scribe. 09/24/16. 10:38 AM.   History   Chief Complaint Chief Complaint  Patient presents with  . Sore Throat   The history is provided by the patient. No language interpreter was used.   HPI Comments: Barry Henderson is a 26 y.o. male who presents to the Emergency Department complaining of sore throat  Went to ED hospital morehec was given antiobiotcs of augmenten and got a shot with tonsillitis. Said that he would feel better, but hasn't. Denies medical problems and fevers and chills. Denies taking anything for his pain. No known allergies.  Pt has not filled his abx prescription. nokid prolems or ulcer his Past Medical History:  Diagnosis Date  . Meningitis     There are no active problems to display for this patient.   Past Surgical History:  Procedure Laterality Date  . LUMBAR PUNCTURE         Home Medications    Prior to Admission medications   Medication Sig Start Date End Date Taking? Authorizing Provider  Multiple Vitamins-Minerals (EMERGEN-C IMMUNE PO) Take 1 tablet by mouth daily as needed (cold).   Yes [provider]    Family History Family History  Problem Relation Age of Onset  . Cancer Other   . Diabetes Other     Social History Social History  Substance Use Topics  . Smoking status: Current Every Day Smoker    Packs/day: 1.00    Years: 4.00    Types: Cigarettes  . Smokeless tobacco: Never Used  . Alcohol use No     Allergies   Poison sumac extract   Review of Systems Review of Systems  HENT: Positive for sore throat.      Physical Exam Updated Vital Signs BP 127/72 (BP Location: Right Arm)   Pulse 80   Temp 98.2 F (36.8 C)  (Oral)   Resp 15   Ht 5\' 5"  (1.651 m)   Wt 68 kg (150 lb)   SpO2 99%   BMI 24.96 kg/m   Physical Exam  Constitutional: He appears well-developed and well-nourished.  HENT:  Head: Normocephalic.  Supple  No meningismus Inflamed tonsils Mild erythema no significant exudate Midline uvula no trismus  Neck: Normal range of motion. Neck supple.  Cardiovascular: Normal rate, regular rhythm and intact distal pulses.  Exam reveals no gallop and no friction rub.   No murmur heard. Pulmonary/Chest: Effort normal and breath sounds normal. No respiratory distress. He has no wheezes. He has no rales. He exhibits no tenderness.  Lymphadenopathy:    He has cervical adenopathy.     ED Treatments / Results   DIAGNOSTIC STUDIES: Oxygen Saturation is 99% on RA, normal by my interpretation.   COORDINATION OF CARE: 10:02 AM-Discussed next steps with pt. Pt verbalized understanding and is agreeable with the plan.   Labs (all labs ordered are listed, but only abnormal results are displayed) Labs Reviewed - No data to display  EKG  EKG Interpretation None       Radiology No results found.  Procedures Procedures (including critical care time)  Medications Ordered in ED Medications  ketorolac (TORADOL) injection 60 mg (60 mg Intramuscular Given 09/24/16 1021)  dexamethasone (DECADRON) injection 10 mg (10 mg Intramuscular Given  09/24/16 1020)     Initial Impression / Assessment and Plan / ED Course  I have reviewed the triage vital signs and the nursing notes.  Pertinent labs & imaging results that were available during my care of the patient were reviewed by me and considered in my medical decision making (see chart for details).    Patient presents with tonsillitis. Patient has not started taking his antibiotics. Patient has no trismus, no drooling, uvula midline. Discussed may be possible for early abscess however no indication for emergent drainage. Discussed antibiotics,  pain meds and follow-up with ENT doctor who GERD has a referral to.  Results and differential diagnosis were discussed with the patient/parent/guardian. Xrays were independently reviewed by myself.  Close follow up outpatient was discussed, comfortable with the plan.   Medications  ketorolac (TORADOL) injection 60 mg (60 mg Intramuscular Given 09/24/16 1021)  dexamethasone (DECADRON) injection 10 mg (10 mg Intramuscular Given 09/24/16 1020)    Vitals:   09/24/16 0952 09/24/16 0958  BP:  127/72  Pulse:  80  Resp:  15  Temp:  98.2 F (36.8 C)  TempSrc:  Oral  SpO2:  99%  Weight: 68 kg (150 lb)   Height: 5\' 5"  (1.651 m)     Final diagnoses:  Acute pharyngitis, unspecified etiology     Final Clinical Impressions(s) / ED Diagnoses   Final diagnoses:  Acute pharyngitis, unspecified etiology    New Prescriptions New Prescriptions   No medications on file       Blane OharaZavitz, Calvary Difranco, MD 09/24/16 1039

## 2017-11-21 ENCOUNTER — Other Ambulatory Visit: Payer: Self-pay

## 2017-11-21 ENCOUNTER — Emergency Department (HOSPITAL_COMMUNITY)
Admission: EM | Admit: 2017-11-21 | Discharge: 2017-11-21 | Disposition: A | Discharge status: Home / Self Care | Payer: BLUE CROSS/BLUE SHIELD | Attending: Emergency Medicine | Admitting: Emergency Medicine

## 2017-11-21 ENCOUNTER — Encounter (HOSPITAL_COMMUNITY): Payer: Self-pay | Admitting: Emergency Medicine

## 2017-11-21 DIAGNOSIS — L237 Allergic contact dermatitis due to plants, except food: Secondary | ICD-10-CM | POA: Diagnosis not present

## 2017-11-21 DIAGNOSIS — F17210 Nicotine dependence, cigarettes, uncomplicated: Secondary | ICD-10-CM | POA: Diagnosis not present

## 2017-11-21 DIAGNOSIS — R21 Rash and other nonspecific skin eruption: Secondary | ICD-10-CM | POA: Diagnosis present

## 2017-11-21 MED ORDER — DEXAMETHASONE SODIUM PHOSPHATE 10 MG/ML IJ SOLN
10.0000 mg | Freq: Once | INTRAMUSCULAR | Status: AC
  Administered 2017-11-21: 10 mg via INTRAMUSCULAR
  Filled 2017-11-21: qty 1

## 2017-11-21 MED ORDER — DEXAMETHASONE 4 MG PO TABS: 4.0000 mg | ORAL_TABLET | Freq: Two times a day (BID) | ORAL | 0 refills | Status: AC

## 2017-11-21 NOTE — Discharge Instructions (Addendum)
Please cleanse the areas on your arm, under your nose, and on your sideburn area with soap and water daily.  Please wash clothes and linen daily until this has resolved.  Use Decadron 2 times daily until all taken.  Start this medication on July 26.  Use Benadryl at bedtime if needed for itching.

## 2017-11-21 NOTE — ED Provider Notes (Signed)
[facility] EMERGENCY DEPARTMENT Provider Note   CSN: [id number] Arrival date & time: 11/21/17  1818     History   Chief Complaint Chief Complaint  Patient presents with  . Rash    HPI Barry Henderson is a 27 y.o. male.  Patient is a 27 year old male who presents to the emergency department with a complaint of rash on the arms.  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  He has been in contact with poison ivy or poison oak.  He says he has it on both arms.  He also thinks he may have seen a small area on the left sideburn area.  Patient also states that he sustained a bee sting to his little toe, but having very little problem with that.  Patient presents now for assistance with this issue.  No difficulty with breathing.  No swelling of the lips or mouth.  The history is provided by the patient.  Rash      Past Medical History:  Diagnosis Date  . Meningitis     There are no active problems to display for this patient.   Past Surgical History:  Procedure Laterality Date  . LUMBAR PUNCTURE          Home Medications    Prior to Admission medications   Medication Sig Start Date End Date Taking? Authorizing Provider  Multiple Vitamins-Minerals (EMERGEN-C IMMUNE PO) Take 1 tablet by mouth daily as needed (cold).    [provider]    Family History Family History  Problem Relation Age of Onset  . Cancer Other   . Diabetes Other     Social History Social History   Tobacco Use  . Smoking status: Current Every Day Smoker    Packs/day: 1.00    Years: 4.00    Pack years: 4.00    Types: Cigarettes  . Smokeless tobacco: Never Used  Substance Use Topics  . Alcohol use: No  . Drug use: Yes    Types: Marijuana     Allergies   Poison sumac extract   Review of Systems Review of Systems  Constitutional: Negative for activity change.       All ROS Neg except as noted in HPI  HENT: Negative for nosebleeds.   Eyes: Negative for photophobia and  discharge.  Respiratory: Negative for cough, shortness of breath and wheezing.   Cardiovascular: Negative for chest pain and palpitations.  Gastrointestinal: Negative for abdominal pain and blood in stool.  Genitourinary: Negative for dysuria, frequency and hematuria.  Musculoskeletal: Negative for arthralgias, back pain and neck pain.  Skin: Positive for rash.  Neurological: Negative for dizziness, seizures and speech difficulty.  Psychiatric/Behavioral: Negative for confusion and hallucinations.     Physical Exam Updated Vital Signs BP 117/72 (BP Location: Right Arm)   Pulse 71   Temp 98 F (36.7 C) (Oral)   Resp 16   SpO2 100%   Physical Exam  Constitutional: He is oriented to person, place, and time. He appears well-developed and well-nourished.  Non-toxic appearance.  HENT:  Head: Normocephalic.  Right Ear: Tympanic membrane and external ear normal.  Left Ear: Tympanic membrane and external ear normal.  No swelling of the lips or tongue.  Airway is patent.  Eyes: Pupils are equal, round, and reactive to light. EOM and lids are normal.  Neck: Normal range of motion. Neck supple. Carotid bruit is not present.  Cardiovascular: Normal rate, regular rhythm, normal heart sounds, intact distal pulses and normal pulses.  Pulmonary/Chest: Breath sounds normal. No respiratory distress.  Abdominal: Soft. Bowel sounds are normal. There is no tenderness. There is no guarding.  Musculoskeletal: Normal range of motion.  Lymphadenopathy:       Head (right side): No submandibular adenopathy present.       Head (left side): No submandibular adenopathy present.    He has no cervical adenopathy.  Neurological: He is alert and oriented to person, place, and time. He has normal strength. No cranial nerve deficit or sensory deficit.  Skin: Skin is warm and dry. Rash noted.  There is a fine macular rash on both arms.  A few of them with blisters.  There is a similar rash just under the nose in  the mustache area, and on the right sideburn area of the face.  No drainage noted.  No red streaking appreciated.  Psychiatric: He has a normal mood and affect. His speech is normal.  Nursing note and vitals reviewed.    ED Treatments / Results  Labs (all labs ordered are listed, but only abnormal results are displayed) Labs Reviewed - No data to display  EKG None  Radiology No results found.  Procedures Procedures (including critical care time)  Medications Ordered in ED Medications - No data to display   Initial Impression / Assessment and Plan / ED Course  I have reviewed the triage vital signs and the nursing notes.  Pertinent labs & imaging results that were available during my care of the patient were reviewed by me and considered in my medical decision making (see chart for details).      Final Clinical Impressions(s) / ED Diagnoses MDM  Vital signs reviewed.  Pulse oximetry is 100% on room air.  Within normal limits by my interpretation.  Examination is consistent with contact dermatitis due to poison ivy or poison oak.  Patient will be treated with intramuscular steroids, and a short course of oral steroids.  The patient will use Benadryl for itching.  Patient to return to the emergency department if any changes, problems, or concerns.   Final diagnoses:  Contact dermatitis due to poison ivy    ED Discharge Orders        Ordered    dexamethasone (DECADRON) 4 MG tablet  2 times daily with meals     11/21/17 1923       Ivery QualeBryant, Danajah Birdsell, PA-C 11/21/17 1927    Long, Arlyss RepressJoshua G, MD 11/22/17 [phone]

## 2017-11-21 NOTE — ED Triage Notes (Signed)
Pt c/o rash to arms x 2 days and states he got stung by bee to the left pinky toe.

## 2019-02-09 ENCOUNTER — Encounter (HOSPITAL_COMMUNITY): Payer: Self-pay | Admitting: Emergency Medicine

## 2019-02-09 ENCOUNTER — Other Ambulatory Visit: Payer: Self-pay

## 2019-02-09 ENCOUNTER — Emergency Department (HOSPITAL_COMMUNITY)
Admission: EM | Admit: 2019-02-09 | Discharge: 2019-02-09 | Disposition: A | Discharge status: Home / Self Care | Payer: BLUE CROSS/BLUE SHIELD | Attending: Emergency Medicine | Admitting: Emergency Medicine

## 2019-02-09 DIAGNOSIS — F17210 Nicotine dependence, cigarettes, uncomplicated: Secondary | ICD-10-CM | POA: Insufficient documentation

## 2019-02-09 DIAGNOSIS — W228XXA Striking against or struck by other objects, initial encounter: Secondary | ICD-10-CM | POA: Insufficient documentation

## 2019-02-09 DIAGNOSIS — Y929 Unspecified place or not applicable: Secondary | ICD-10-CM | POA: Insufficient documentation

## 2019-02-09 DIAGNOSIS — Z79899 Other long term (current) drug therapy: Secondary | ICD-10-CM | POA: Insufficient documentation

## 2019-02-09 DIAGNOSIS — S0501XA Injury of conjunctiva and corneal abrasion without foreign body, right eye, initial encounter: Secondary | ICD-10-CM | POA: Insufficient documentation

## 2019-02-09 DIAGNOSIS — Y999 Unspecified external cause status: Secondary | ICD-10-CM | POA: Insufficient documentation

## 2019-02-09 DIAGNOSIS — Y9389 Activity, other specified: Secondary | ICD-10-CM | POA: Insufficient documentation

## 2019-02-09 MED ORDER — ERYTHROMYCIN 5 MG/GM OP OINT: TOPICAL_OINTMENT | OPHTHALMIC | 0 refills | Status: AC

## 2019-02-09 MED ORDER — FLUORESCEIN SODIUM 1 MG OP STRP
1.0000 | ORAL_STRIP | Freq: Once | OPHTHALMIC | Status: AC
  Administered 2019-02-09: 1 via OPHTHALMIC
  Filled 2019-02-09: qty 1

## 2019-02-09 MED ORDER — TETRACAINE HCL 0.5 % OP SOLN
2.0000 [drp] | Freq: Once | OPHTHALMIC | Status: AC
  Administered 2019-02-09: 2 [drp] via OPHTHALMIC
  Filled 2019-02-09: qty 4

## 2019-02-09 NOTE — Discharge Instructions (Signed)
You were seen in the emergency department today following an eye injury.  Your exam is consistent with a corneal abrasion.  We are sending you home with an antibiotic to prevent infection, please apply erythromycin ointment to the eye 4 times per day for the next 7 days.  Please follow-up with ophthalmology within 48 hours, we have given you our ophthalmologist on-call, please call tomorrow morning to make an appointment.  Return to the ER for new or worsening symptoms including but limited to increased pain, blurry vision, redness/swelling around the eye, drainage from the eye, fever, bulging of the eye, or any other concerns.

## 2019-02-09 NOTE — ED Triage Notes (Signed)
Pt was hammering a nail and it flew back and hit his RT eye.  Pt can see but its blurry.

## 2019-02-09 NOTE — ED Provider Notes (Signed)
[facility] EMERGENCY DEPARTMENT Provider Note   CSN: [id number] Arrival date & time: 02/09/19  1901     History   Chief Complaint Chief Complaint  Patient presents with  . Eye Injury    HPI Barry Henderson is a 28 y.o. male with a hx of tobacco abuse who presents to the ED w/ complaints of R eye injury that occurred 1 hour PTA. Patient states he was hammering a nail when the nail dislodged, came back, & struck his R eye. States the area is very painful, 10/10, no alleviating/aggravating factors. . He states that there was watering/bleeding from the eye which seemed like it made his vision a bit blurry but grossly it is intact. He is not a contact lens or glasses wearer. Denies fever, chills, purulent drainage, loss of vision, double vision, vomiting, or other areas of injury. Last tetanus was 1 year ago.     HPI  Past Medical History:  Diagnosis Date  . Meningitis     There are no active problems to display for this patient.   Past Surgical History:  Procedure Laterality Date  . LUMBAR PUNCTURE          Home Medications    Prior to Admission medications   Medication Sig Start Date End Date Taking? Authorizing Provider  dexamethasone (DECADRON) 4 MG tablet Take 1 tablet (4 mg total) by mouth 2 (two) times daily with a meal. 11/21/17   Lily Kocher, PA-C  Multiple Vitamins-Minerals (EMERGEN-C IMMUNE PO) Take 1 tablet by mouth daily as needed (cold).    [provider]    Family History Family History  Problem Relation Age of Onset  . Cancer Other   . Diabetes Other     Social History Social History   Tobacco Use  . Smoking status: Current Every Day Smoker    Packs/day: 1.00    Years: 4.00    Pack years: 4.00    Types: Cigarettes  . Smokeless tobacco: Never Used  Substance Use Topics  . Alcohol use: No  . Drug use: Yes    Types: Marijuana     Allergies   Poison sumac extract   Review of Systems Review of Systems  Constitutional:  Negative for chills and fever.  Eyes: Positive for pain and visual disturbance.  Respiratory: Negative for shortness of breath.   Cardiovascular: Negative for chest pain.  Gastrointestinal: Negative for nausea and vomiting.  Neurological: Negative for headaches.   Physical Exam Updated Vital Signs BP 128/86 (BP Location: Right Arm)   Pulse 77   Temp 98.3 F (36.8 C) (Oral)   Resp 20   Ht 5\' 6"  (1.676 m)   Wt 70.3 kg   SpO2 100%   BMI 25.02 kg/m   Physical Exam Vitals signs and nursing note reviewed.  Constitutional:      General: He is not in acute distress.    Appearance: He is well-developed.  HENT:     Head: Normocephalic and atraumatic.  Eyes:     General: Lids are everted, no foreign bodies appreciated. Vision grossly intact. Gaze aligned appropriately.        Right eye: No discharge.        Left eye: No discharge.     Extraocular Movements: Extraocular movements intact.     Conjunctiva/sclera:     Right eye: Right conjunctiva is injected. No exudate.    Left eye: Left conjunctiva is not injected. No exudate or hemorrhage.    Pupils: Pupils are  equal, round, and reactive to light.     Comments: Visual acuity:  L eye: 20/15 R eye: 20/15 PERRL. EOMI.  No periorbital swelling/edema/ecchymosis.  Fluorescein stain R eye: 5 mm corneal abrasion noted just lateral to the iris. No obvious FB. No ulceration. Negative seidel sign. No hyphema. No active bleeding.    Neurological:     Mental Status: He is alert.     Comments: Clear speech.   Psychiatric:        Behavior: Behavior normal.        Thought Content: Thought content normal.        ED Treatments / Results  Labs (all labs ordered are listed, but only abnormal results are displayed) Labs Reviewed - No data to display  EKG None  Radiology No results found.  Procedures Procedures (including critical care time)  Medications Ordered in ED Medications  fluorescein ophthalmic strip 1 strip (1 strip  Right Eye Given by Other 02/09/19 2035)  tetracaine (PONTOCAINE) 0.5 % ophthalmic solution 2 drop (2 drops Right Eye Given by Other 02/09/19 2035)     Initial Impression / Assessment and Plan / ED Course  I have reviewed the triage vital signs and the nursing notes.  Pertinent labs & imaging results that were available during my care of the patient were reviewed by me and considered in my medical decision making (see chart for details).   Patient presents to the ED s/p R eye injury.  Negative seidel- does not seem consistent w/ open globe injury.  No periorbital ecchymosis/edema or findings consistent w/ orbital compartment syndrome.  No hyphema noted.  Exam w/ corneal abrasion as well as conjunctival injection- question conjunctival hemorrhage as well. Vision intact. Tetanus uptodate. Not a contact lens wearer. Discussed w/ supervising physician Dr. Estell Harpin who has evaluated patient- recommends discharge home w/ abx ointment & ophthalmology follow up which I am in agreement with. I discussed results, treatment plan, need for follow-up, and return precautions with the patient. Provided opportunity for questions, patient confirmed understanding and is in agreement with plan.   Final Clinical Impressions(s) / ED Diagnoses   Final diagnoses:  Abrasion of right cornea, initial encounter    ED Discharge Orders         Ordered    erythromycin ophthalmic ointment     02/09/19 2100           Cherly Anderson, PA-C 02/09/19 2101    Bethann Berkshire, MD 02/14/19 1025

## 2019-12-22 ENCOUNTER — Encounter (HOSPITAL_COMMUNITY): Payer: Self-pay | Admitting: Emergency Medicine

## 2019-12-22 ENCOUNTER — Emergency Department (HOSPITAL_COMMUNITY)
Admission: EM | Admit: 2019-12-22 | Discharge: 2019-12-22 | Disposition: A | Discharge status: Home / Self Care | Payer: Self-pay | Attending: Emergency Medicine | Admitting: Emergency Medicine

## 2019-12-22 ENCOUNTER — Other Ambulatory Visit: Payer: Self-pay

## 2019-12-22 DIAGNOSIS — L509 Urticaria, unspecified: Secondary | ICD-10-CM | POA: Insufficient documentation

## 2019-12-22 DIAGNOSIS — T782XXA Anaphylactic shock, unspecified, initial encounter: Secondary | ICD-10-CM | POA: Insufficient documentation

## 2019-12-22 DIAGNOSIS — F17210 Nicotine dependence, cigarettes, uncomplicated: Secondary | ICD-10-CM | POA: Insufficient documentation

## 2019-12-22 MED ORDER — PREDNISONE 20 MG PO TABS: 40.0000 mg | ORAL_TABLET | Freq: Every day | ORAL | 0 refills | Status: AC

## 2019-12-22 MED ORDER — FAMOTIDINE IN NACL 20-0.9 MG/50ML-% IV SOLN
40.0000 mg | Freq: Once | INTRAVENOUS | Status: AC
  Administered 2019-12-22: 40 mg via INTRAVENOUS
  Filled 2019-12-22: qty 100

## 2019-12-22 MED ORDER — EPINEPHRINE 0.3 MG/0.3ML IJ SOAJ
0.3000 mg | Freq: Once | INTRAMUSCULAR | Status: AC
  Administered 2019-12-22: 0.3 mg via INTRAMUSCULAR
  Filled 2019-12-22: qty 0.3

## 2019-12-22 MED ORDER — SODIUM CHLORIDE 0.9 % IV BOLUS
1000.0000 mL | Freq: Once | INTRAVENOUS | Status: AC
  Administered 2019-12-22: 1000 mL via INTRAVENOUS

## 2019-12-22 MED ORDER — EPINEPHRINE 0.3 MG/0.3ML IJ SOAJ: 0.3000 mg | INTRAMUSCULAR | 0 refills | Status: AC | PRN

## 2019-12-22 MED ORDER — METHYLPREDNISOLONE SODIUM SUCC 125 MG IJ SOLR
125.0000 mg | Freq: Once | INTRAMUSCULAR | Status: AC
  Administered 2019-12-22: 125 mg via INTRAVENOUS
  Filled 2019-12-22: qty 2

## 2019-12-22 MED ORDER — DIPHENHYDRAMINE HCL 50 MG/ML IJ SOLN
50.0000 mg | Freq: Once | INTRAMUSCULAR | Status: AC
  Administered 2019-12-22: 50 mg via INTRAVENOUS
  Filled 2019-12-22: qty 1

## 2019-12-22 NOTE — ED Provider Notes (Signed)
Emergency Department Provider Note   I have reviewed the triage vital signs and the nursing notes.   HISTORY  Chief Complaint Allergic Reaction   HPI Barry Henderson is a 29 y.o. male with PMH reviewed below and no prior history of anaphylaxis presents to the emergency department with hive-like rash and throat tightness.  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  Patient works outside frequently and has not had a similar reaction in the past.  No new medications.  Patient denies any chest pain, shortness of breath, vomiting, diarrhea, abdominal cramping.  Denies any lightheadedness.  He does not recall feeling a sting or bite while working in the yard. No radiation of symptoms or modifying factors.   Past Medical History:  Diagnosis Date   Meningitis     There are no problems to display for this patient.   Past Surgical History:  Procedure Laterality Date   LUMBAR PUNCTURE      Allergies Poison sumac extract  Family History  Problem Relation Age of Onset   Cancer Other    Diabetes Other     Social History Social History   Tobacco Use   Smoking status: Current Every Day Smoker    Packs/day: 1.00    Years: 4.00    Pack years: 4.00    Types: Cigarettes   Smokeless tobacco: Never Used  Substance Use Topics   Alcohol use: No   Drug use: Yes    Types: Marijuana    Review of Systems  Constitutional: No fever/chills Eyes: No visual changes. ENT: No sore throat. Positive throat tightness.  Cardiovascular: Denies chest pain. Respiratory: Denies shortness of breath. Gastrointestinal: No abdominal pain.  No nausea, no vomiting.  No diarrhea.  No constipation. Genitourinary: Negative for dysuria. Musculoskeletal: Negative for back pain. Skin: Positive diffuse itchy rash.  Neurological: Negative for headaches, focal weakness or numbness.  10-point ROS otherwise  negative.  ____________________________________________   PHYSICAL EXAM:  VITAL SIGNS: Vitals:   12/22/19 1729  BP: (!) 133/93  Pulse: 85  Resp: 18  Temp: 98 F (36.7 C)  SpO2: 100%    Constitutional: Alert and oriented. Well appearing and in no acute distress. Eyes: Conjunctivae are normal.  Head: Atraumatic. Nose: No congestion/rhinnorhea. Mouth/Throat: Mucous membranes are moist.  Oropharynx non-erythematous. No tongue swelling. Widely patent oropharynx and clear voice. Managing oral secretions.  Neck: No stridor.   Cardiovascular: Mild tachycardia. Good peripheral circulation. Grossly normal heart sounds.   Respiratory: Normal respiratory effort.  No retractions. Lungs CTAB. No wheezing.  Gastrointestinal: No distention.  Musculoskeletal: No gross deformities of extremities. Neurologic:  Normal speech and language.  Skin:  Skin is warm and dry. Diffuse hive-like rash over arms, legs, and torso.   ____________________________________________   PROCEDURES  Procedure(s) performed:   Procedures  CRITICAL CARE Performed by: Maia Plan Total critical care time: 35 minutes Critical care time was exclusive of separately billable procedures and treating other patients. Critical care was necessary to treat or prevent imminent or life-threatening deterioration. Critical care was time spent personally by me on the following activities: development of treatment plan with patient and/or surrogate as well as nursing, discussions with consultants, evaluation of patient's response to treatment, examination of patient, obtaining history from patient or surrogate, ordering and performing treatments and interventions, ordering and review of laboratory studies, ordering and review of radiographic studies, pulse oximetry and re-evaluation of patient's condition.  Alona Bene, MD Emergency Medicine  ____________________________________________  INITIAL IMPRESSION / ASSESSMENT  AND PLAN / ED COURSE  Pertinent labs & imaging results that were available during my care of the patient were reviewed by me and considered in my medical decision making (see chart for details).   Patient presents to the emergency department for evaluation of anaphylactic-like reaction with unknown clear provoking factor.  Patient has hives and subjective throat tightness but is ambulatory, speaking clearly, and normal ENT exam. Plan for EpiPen with subjective throat tightness along with steroid, benadryl, and pepcid. Will obs in the ED for 4 hours. Patient in hallway bed but directly in front of nursing staff for observation.   08:45 PM  Patient continues to feel much better after reevaluation.  He is not showing sign of rebound or return reaction.  Will discharge home with prescription for EpiPen along with steroid burst over the next several days.  Discussed ED return precautions and provided contact information for PCP. ____________________________________________  FINAL CLINICAL IMPRESSION(S) / ED DIAGNOSES  Final diagnoses:  Anaphylaxis, initial encounter  Hives     MEDICATIONS GIVEN DURING THIS VISIT:  Medications  EPINEPHrine (EPI-PEN) injection 0.3 mg (0.3 mg Intramuscular Given 12/22/19 1742)  sodium chloride 0.9 % bolus 1,000 mL (0 mLs Intravenous Stopped 12/22/19 1857)  methylPREDNISolone sodium succinate (SOLU-MEDROL) 125 mg/2 mL injection 125 mg (125 mg Intravenous Given 12/22/19 1737)  famotidine (PEPCID) IVPB 20 mg premix (0 mg Intravenous Stopped 12/22/19 1809)  diphenhydrAMINE (BENADRYL) injection 50 mg (50 mg Intravenous Given 12/22/19 1737)     NEW OUTPATIENT MEDICATIONS STARTED DURING THIS VISIT:  New Prescriptions   EPINEPHRINE 0.3 MG/0.3 ML IJ SOAJ INJECTION    Inject 0.3 mLs (0.3 mg total) into the muscle as needed for anaphylaxis.    Note:  This document was prepared using Dragon voice recognition software and may include unintentional dictation  errors.  Alona Bene, MD, [facility]    Whitleigh Garramone, Arlyss Repress, MD 12/22/19 2049

## 2019-12-22 NOTE — Discharge Instructions (Signed)

## 2019-12-22 NOTE — ED Triage Notes (Signed)
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 Source unknown.

## 2020-04-11 ENCOUNTER — Encounter (HOSPITAL_COMMUNITY): Payer: Self-pay | Admitting: *Deleted

## 2020-04-11 ENCOUNTER — Emergency Department (HOSPITAL_COMMUNITY)
Admission: EM | Admit: 2020-04-11 | Discharge: 2020-04-12 | Disposition: A | Discharge status: Home / Self Care | Payer: HRSA Program | Attending: Emergency Medicine | Admitting: Emergency Medicine

## 2020-04-11 ENCOUNTER — Other Ambulatory Visit: Payer: Self-pay

## 2020-04-11 DIAGNOSIS — F17210 Nicotine dependence, cigarettes, uncomplicated: Secondary | ICD-10-CM | POA: Insufficient documentation

## 2020-04-11 DIAGNOSIS — U071 COVID-19: Secondary | ICD-10-CM | POA: Insufficient documentation

## 2020-04-11 DIAGNOSIS — F1290 Cannabis use, unspecified, uncomplicated: Secondary | ICD-10-CM | POA: Diagnosis not present

## 2020-04-11 DIAGNOSIS — R451 Restlessness and agitation: Secondary | ICD-10-CM | POA: Insufficient documentation

## 2020-04-11 DIAGNOSIS — F1510 Other stimulant abuse, uncomplicated: Secondary | ICD-10-CM | POA: Diagnosis present

## 2020-04-11 DIAGNOSIS — F1910 Other psychoactive substance abuse, uncomplicated: Secondary | ICD-10-CM

## 2020-04-11 DIAGNOSIS — R000 Tachycardia, unspecified: Secondary | ICD-10-CM | POA: Diagnosis not present

## 2020-04-11 DIAGNOSIS — F29 Unspecified psychosis not due to a substance or known physiological condition: Secondary | ICD-10-CM

## 2020-04-11 DIAGNOSIS — F1514 Other stimulant abuse with stimulant-induced mood disorder: Secondary | ICD-10-CM | POA: Insufficient documentation

## 2020-04-11 LAB — RAPID URINE DRUG SCREEN, HOSP PERFORMED
Amphetamines: POSITIVE — AB
Barbiturates: NOT DETECTED
Benzodiazepines: NOT DETECTED
Cocaine: NOT DETECTED
Opiates: NOT DETECTED
Tetrahydrocannabinol: POSITIVE — AB

## 2020-04-11 LAB — CBC
HCT: 45.7 % (ref 39.0–52.0)
Hemoglobin: 16 g/dL (ref 13.0–17.0)
MCH: 30.9 pg (ref 26.0–34.0)
MCHC: 35 g/dL (ref 30.0–36.0)
MCV: 88.2 fL (ref 80.0–100.0)
Platelets: 361 10*3/uL (ref 150–400)
RBC: 5.18 MIL/uL (ref 4.22–5.81)
RDW: 12.1 % (ref 11.5–15.5)
WBC: 7.8 10*3/uL (ref 4.0–10.5)
nRBC: 0 % (ref 0.0–0.2)

## 2020-04-11 LAB — COMPREHENSIVE METABOLIC PANEL
ALT: 18 U/L (ref 0–44)
AST: 20 U/L (ref 15–41)
Albumin: 4.8 g/dL (ref 3.5–5.0)
Alkaline Phosphatase: 97 U/L (ref 38–126)
Anion gap: 11 (ref 5–15)
BUN: 14 mg/dL (ref 6–20)
CO2: 25 mmol/L (ref 22–32)
Calcium: 9.3 mg/dL (ref 8.9–10.3)
Chloride: 101 mmol/L (ref 98–111)
Creatinine, Ser: 0.74 mg/dL (ref 0.61–1.24)
GFR, Estimated: >60 mL/min (ref >60)
Glucose, Bld: 115 mg/dL — ABNORMAL HIGH (ref 70–99)
Potassium: 3.5 mmol/L (ref 3.5–5.1)
Sodium: 137 mmol/L (ref 135–145)
Total Bilirubin: 1.2 mg/dL (ref 0.3–1.2)
Total Protein: 8.6 g/dL — ABNORMAL HIGH (ref 6.5–8.1)

## 2020-04-11 LAB — ETHANOL: Alcohol, Ethyl (B): <10 mg/dL (ref <10)

## 2020-04-11 MED ORDER — ONDANSETRON 4 MG PO TBDP: 4.0000 mg | ORAL_TABLET | Freq: Four times a day (QID) | ORAL | Status: DC | PRN

## 2020-04-11 MED ORDER — LORAZEPAM 1 MG PO TABS
1.0000 mg | ORAL_TABLET | Freq: Once | ORAL | Status: AC
  Administered 2020-04-12: 01:00:00 1 mg via ORAL
  Filled 2020-04-11: qty 1

## 2020-04-11 MED ORDER — NAPROXEN 250 MG PO TABS
500.0000 mg | ORAL_TABLET | Freq: Two times a day (BID) | ORAL | Status: DC | PRN
Start: 2020-04-11 — End: 2020-04-11

## 2020-04-11 MED ORDER — LOPERAMIDE HCL 2 MG PO CAPS: 2.0000 mg | ORAL_CAPSULE | ORAL | Status: DC | PRN

## 2020-04-11 MED ORDER — CLONIDINE HCL 0.1 MG PO TABS: 0.1000 mg | ORAL_TABLET | Freq: Every day | ORAL | Status: DC

## 2020-04-11 MED ORDER — CLONIDINE HCL 0.1 MG PO TABS: 0.1000 mg | ORAL_TABLET | Freq: Four times a day (QID) | ORAL | Status: DC

## 2020-04-11 MED ORDER — STERILE WATER FOR INJECTION IJ SOLN
INTRAMUSCULAR | Status: AC
  Filled 2020-04-11: qty 10

## 2020-04-11 MED ORDER — ZIPRASIDONE MESYLATE 20 MG IM SOLR
10.0000 mg | Freq: Once | INTRAMUSCULAR | Status: AC
  Administered 2020-04-11: 15:00:00 10 mg via INTRAMUSCULAR
  Filled 2020-04-11: qty 20

## 2020-04-11 MED ORDER — METHOCARBAMOL 500 MG PO TABS: 500.0000 mg | ORAL_TABLET | Freq: Three times a day (TID) | ORAL | Status: DC | PRN

## 2020-04-11 MED ORDER — DICYCLOMINE HCL 20 MG PO TABS: 20.0000 mg | ORAL_TABLET | Freq: Four times a day (QID) | ORAL | Status: DC | PRN

## 2020-04-11 MED ORDER — HYDROXYZINE HCL 25 MG PO TABS: 25.0000 mg | ORAL_TABLET | Freq: Four times a day (QID) | ORAL | Status: DC | PRN

## 2020-04-11 MED ORDER — CLONIDINE HCL 0.1 MG PO TABS: 0.1000 mg | ORAL_TABLET | Freq: Two times a day (BID) | ORAL | Status: DC

## 2020-04-11 NOTE — ED Notes (Signed)
Barry Henderson Aunt called and requested pt be sent off somewhere because she believes he is bipolar schizophrenic. [phone]
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
## 2020-04-11 NOTE — ED Notes (Addendum)
Pt sleeping at this time. Put dinner tray at bedside. Did not wake pt to obtain vitals. [employer] at bedside.

## 2020-04-11 NOTE — ED Notes (Addendum)
Pt is becoming increasingly agitated and is yelling in the room. EDP made aware of pt behavior. PO medication ordered however pt is refusing oral medication because he "has been poisoned." LEO at bedside as pt has forensic restraints to right wrist and right ankle. No injury noted at this time.

## 2020-04-12 DIAGNOSIS — F1510 Other stimulant abuse, uncomplicated: Secondary | ICD-10-CM

## 2020-04-12 LAB — RESP PANEL BY RT-PCR (FLU A&B, COVID) ARPGX2
Influenza A by PCR: NEGATIVE
Influenza B by PCR: NEGATIVE
SARS Coronavirus 2 by RT PCR: POSITIVE — AB

## 2020-04-12 MED ORDER — RISPERIDONE 1 MG PO TBDP
1.0000 mg | ORAL_TABLET | Freq: Once | ORAL | Status: AC
  Administered 2020-04-12: 12:00:00 1 mg via ORAL
  Filled 2020-04-12: qty 1

## 2020-04-12 MED ORDER — RISPERIDONE 0.5 MG PO TABS: 0.5000 mg | ORAL_TABLET | Freq: Two times a day (BID) | ORAL | 0 refills | Status: AC

## 2020-04-12 MED ORDER — ZIPRASIDONE MESYLATE 20 MG IM SOLR: 10.0000 mg | Freq: Once | INTRAMUSCULAR | Status: DC

## 2020-04-12 NOTE — ED Provider Notes (Addendum)
Emergency Medicine Observation Re-evaluation Note  Barry Henderson is a 29 y.o. male, seen on rounds today.  Pt initially presented to the ED for complaints of Drug Problem Currently, the patient is sleeping comfortably in the room pending inpatient treatment..  Physical Exam  BP 115/69 (BP Location: Right Arm)   Pulse 87   Temp 98 F (36.7 C) (Oral)   Resp 15   SpO2 100%  Physical Exam Sleeping comfortably in room.  ED Course / MDM  EKG:    I have reviewed the labs performed to date as well as medications administered while in observation.  Recent changes in the last 24 hours include calm overnight.  Yesterday required Geodon.  However patient now has come back positive as Covid..  Unable to tell of infectivity at this time since patient reportedly is not having symptoms  Plan  Current plan is for was for inpatient treatment.. Patient is under full IVC at this time.   Benjiman Core, MD 04/12/20 [phone]  Patient feeling more agitated.  Requesting another shot of Geodon.    Benjiman Core, MD 04/12/20 1127  Patient seen by psychiatry and cleared for discharge.  Had been given oral medicine here and new medicine for discharge    Benjiman Core, MD 04/12/20 1237

## 2020-04-12 NOTE — ED Notes (Signed)
Pt provided with phone, spoke with spouse and arranged transportation at this time. All discharge education provided to patient, pt states "I dunno what it is but I'll eat some peanuts and sleep." Pt ambulated out of department without assistance from staff with all personal belongings.  IVC Resonation papers signed and documented per policy,.

## 2020-04-12 NOTE — ED Notes (Signed)
J. Gerilyn Pilgrim, NP paged and made aware that patient appears to restless and escalating. Pt is making statements to staff of "I just wanna go and I'll go anything I have to so I can get [place] here. If that means that I run and cough on anyone who stops me then I will." Pt is playing with oxygen flow meter, stating "Why can't I use it?" Pt educated on use and purpose of flow meter, pt sitting in bed at this time with 1:1 at bedside.

## 2020-04-12 NOTE — ED Notes (Signed)
Telepsyche set up for pt.

## 2020-04-12 NOTE — BH Assessment (Signed)
Wyatt Haste, PA, recommends overnight observation for safety and stabilization with psych reassessment in the AM. Patient will not be transferred to [facility] due to IVC.

## 2020-04-12 NOTE — Discharge Instructions (Signed)
Follow-up with the resources given.  Start the new medicine.

## 2020-04-12 NOTE — ED Notes (Signed)
This RN to bedside and introduced self to patient and explained role in plan of care. At this time, patient appears in no acute distress, respirations are even and unlabored with equal chest rise and fall. Pt in maroon scrubs with all belonging secured away from patient. This RN notes that patient is resting in bed, watching television at this time. Pt states "I'm just ready to get out of here." This RN spoke with Earlyne Iba, NP regarding patient's request for Geodon, and new orders obtained at this time. All questions and concerns voiced addressed at this time. 1:1 sitter remains at bedside, door closed and curtain remains open. Will continue to monitor.

## 2020-04-12 NOTE — ED Notes (Addendum)
This RN spoke with Behavioral Health, educated that patient will be cleared and to contact fiance Maralyn Sago at [phone] when needed for transportation. This RN in agreement at this time.  Educated that patient is requesting "another shot of Geodon." This RN to bedside for assessment.   11:24 AM Dr. Rubin Payor made aware of patient's request for "another shot of Geodon." Currently awaiting a page back at this time.

## 2020-04-12 NOTE — ED Notes (Signed)
While at bedside, pt states to this RN "I feel like a animal. I'm stuck in a cage and all I want to do is go to sleep." Pt reports "I can only be patent for so long and then I'm gonna get angry." This RN was able to redirect patient and provided comfort measures with a dark room, door closed and and curtain opened. At this time, patient is calm and resting in bed with no signs of distress noted.
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
## 2020-04-12 NOTE — ED Notes (Signed)
CRITICAL VALUE ALERT  Critical Value:  Covid positive Date & Time Notied:  04/12/20 @ 0312 Provider Notified: Dr Lars Mage Orders Received/Actions taken: None yet

## 2020-04-13 ENCOUNTER — Telehealth (HOSPITAL_COMMUNITY): Payer: Self-pay

## 2020-04-13 ENCOUNTER — Telehealth: Payer: Self-pay | Admitting: Nurse Practitioner

## 2020-04-13 NOTE — Telephone Encounter (Signed)
RN reviewed chart after receiving a referral for the patient for a monoclonal antibody infusion for those with mild to moderate Covid symptoms and at a high risk of hospitalization.     Due to safety concerns will address with an APP.

## 2020-04-13 NOTE — Telephone Encounter (Signed)
Referral was received for patient to be considered for the monoclonal antibody infusion for those with mild to moderate Covid symptoms and at a high risk of hospitalization.     Although patient has tested positive for the COVID virus he is not a candidate for treatment at the outpatient infusion center. Patient with multiple positive results after a recent urine toxicology in addition to behavior assessment that required safety and security precautions during a recent ER visit.   Unfortunately patient is considered high risk for drug-medication interaction, safe administration, and risk for conflicting behaviors.   Willette Alma, NP [facility]  [phone]

## 2022-05-10 ENCOUNTER — Encounter (HOSPITAL_COMMUNITY): Payer: Self-pay | Admitting: Emergency Medicine

## 2022-05-10 ENCOUNTER — Emergency Department (HOSPITAL_COMMUNITY)
Admission: EM | Admit: 2022-05-10 | Discharge: 2022-05-10 | Disposition: A | Discharge status: Home / Self Care | Payer: Self-pay | Attending: Emergency Medicine | Admitting: Emergency Medicine

## 2022-05-10 ENCOUNTER — Encounter (HOSPITAL_COMMUNITY): Payer: Self-pay | Admitting: *Deleted

## 2022-05-10 ENCOUNTER — Other Ambulatory Visit: Payer: Self-pay

## 2022-05-10 DIAGNOSIS — R09A2 Foreign body sensation, throat: Secondary | ICD-10-CM

## 2022-05-10 DIAGNOSIS — F458 Other somatoform disorders: Secondary | ICD-10-CM | POA: Insufficient documentation

## 2022-05-10 DIAGNOSIS — R070 Pain in throat: Secondary | ICD-10-CM | POA: Insufficient documentation

## 2022-05-10 HISTORY — DX: Other psychoactive substance abuse, uncomplicated: F19.10

## 2022-05-10 LAB — GROUP A STREP BY PCR: Group A Strep by PCR: NOT DETECTED

## 2022-05-10 MED ORDER — PREDNISONE 10 MG PO TABS: 20.0000 mg | ORAL_TABLET | Freq: Two times a day (BID) | ORAL | 0 refills | Status: DC

## 2022-05-10 MED ORDER — PREDNISONE 20 MG PO TABS
40.0000 mg | ORAL_TABLET | Freq: Once | ORAL | Status: AC
  Administered 2022-05-10: 40 mg via ORAL
  Filled 2022-05-10: qty 2

## 2022-05-10 MED ORDER — PENICILLIN V POTASSIUM 500 MG PO TABS: 500.0000 mg | ORAL_TABLET | Freq: Three times a day (TID) | ORAL | 0 refills | Status: AC

## 2022-05-10 NOTE — Discharge Instructions (Signed)
Begin taking prednisone as prescribed.  Continue taking clindamycin as previously prescribed, but be certain to take this with food.  Follow-up with primary doctor if not improving in the next week, and return to the ER if symptoms significantly worsen or change.

## 2022-05-10 NOTE — ED Triage Notes (Addendum)
Pt c/o sore throat since yesterday. Throat is red and enlarged on right side.  Denies fever. Barry Corona Edd Fabian

## 2022-05-10 NOTE — ED Provider Notes (Signed)
[facility] Provider Note   CSN: [id number] Arrival date & time: 05/10/22  1436     History  No chief complaint on file.   Barry Henderson is a 32 y.o. male.  HPI   This patient is a 32 year old male with known history of a tooth ache for which she was treated with clindamycin about 5 days ago.  He has been on the medicine for 4 days but almost immediately after taking the medication he noted some swelling in his face which immediately improved the next day, since then he has had a globus sensation in his throat but has had no difficulty swallowing, he feels like he cannot breathe but actually has no difficulty moving air, tolerating his own secretions and feels like there is no swelling.  He has had no pain in his neck with range of motion, no fevers or chills, no coughing, no chest pain, no headache.  Overall the tooth ache is completely gone away but he is left with his feeling of globus sensation.  He was seen in the emergency department about 10 hours ago, prescribed prednisone but did not yet pick it up.  Home Medications Prior to Admission medications   Medication Sig Start Date End Date Taking? Authorizing Provider  penicillin v potassium (VEETID) 500 MG tablet Take 1 tablet (500 mg total) by mouth 3 (three) times daily for 7 days. 05/10/22 05/17/22 Yes Noemi Chapel, MD  dexamethasone (DECADRON) 4 MG tablet Take 1 tablet (4 mg total) by mouth 2 (two) times daily with a meal. Patient not taking: Reported on 04/12/2020 11/21/17   Lily Kocher, PA-C  EPINEPHrine 0.3 mg/0.3 mL IJ SOAJ injection Inject 0.3 mLs (0.3 mg total) into the muscle as needed for anaphylaxis. 12/22/19   Long, Wonda Olds, MD  erythromycin ophthalmic ointment Place a 1/2 inch ribbon of ointment into the lower right eyelid four times per day for the next 7 days Patient not taking: Reported on 04/12/2020 02/09/19   Petrucelli, Glynda Jaeger, PA-C  Multiple Vitamins-Minerals (EMERGEN-C IMMUNE PO) Take 1  tablet by mouth daily as needed (cold).    [provider]  predniSONE (DELTASONE) 10 MG tablet Take 2 tablets (20 mg total) by mouth 2 (two) times daily. 05/10/22   Veryl Speak, MD  risperiDONE (RISPERDAL) 0.5 MG tablet Take 1 tablet (0.5 mg total) by mouth 2 (two) times daily. 04/12/20 04/12/21  Connye Burkitt, NP      Allergies    Poison sumac extract    Review of Systems   Review of Systems  All other systems reviewed and are negative.   Physical Exam Updated Vital Signs BP 134/83 (BP Location: Right Arm)   Pulse 67   Temp (!) 97.5 F (36.4 C) (Oral)   Resp 16   SpO2 100%  Physical Exam Vitals and nursing note reviewed.  Constitutional:      General: He is not in acute distress.    Appearance: He is well-developed.  HENT:     Head: Normocephalic and atraumatic.     Mouth/Throat:     Pharynx: No oropharyngeal exudate.     Comments: Pharynx is totally unremarkable, no erythema exudate asymmetry or hypertrophy, no tenderness under the tongue, dentition appears to be intact, there is no swelling of the gingiva, no trismus or torticollis Eyes:     General: No scleral icterus.       Right eye: No discharge.        Left eye: No  discharge.     Conjunctiva/sclera: Conjunctivae normal.     Pupils: Pupils are equal, round, and reactive to light.  Neck:     Thyroid: No thyromegaly.     Vascular: No JVD.  Cardiovascular:     Rate and Rhythm: Normal rate and regular rhythm.     Heart sounds: Normal heart sounds. No murmur heard.    No friction rub. No gallop.  Pulmonary:     Effort: Pulmonary effort is normal. No respiratory distress.     Breath sounds: Normal breath sounds. No wheezing or rales.  Abdominal:     General: Bowel sounds are normal. There is no distension.     Palpations: Abdomen is soft. There is no mass.     Tenderness: There is no abdominal tenderness.  Musculoskeletal:        General: No tenderness. Normal range of motion.     Cervical back:  Normal range of motion and neck supple.     Right lower leg: No edema.     Left lower leg: No edema.  Lymphadenopathy:     Cervical: No cervical adenopathy.  Skin:    General: Skin is warm and dry.     Findings: No erythema or rash.  Neurological:     Mental Status: He is alert.     Coordination: Coordination normal.  Psychiatric:        Behavior: Behavior normal.     ED Results / Procedures / Treatments   Labs (all labs ordered are listed, but only abnormal results are displayed) Labs Reviewed - No data to display  EKG None  Radiology No results found.  Procedures Procedures    Medications Ordered in ED Medications - No data to display  ED Course/ Medical Decision Making/ A&P                           Medical Decision Making Risk Prescription drug management.   This patient is extremely well-appearing but has a globus sensation.  I will discontinue the clindamycin and rather start him on penicillin, he does not have any swelling that would suggest the need for aggressive antibiotic therapy at this time.  The patient is agreeable, he understands that he needs to start the prednisone and follow-up closely.  He is totally stable for discharge.        Final Clinical Impression(s) / ED Diagnoses Final diagnoses:  Globus sensation    Rx / DC Orders ED Discharge Orders          Ordered    penicillin v potassium (VEETID) 500 MG tablet  3 times daily        05/10/22 1519              Noemi Chapel, MD 05/10/22 [phone]

## 2022-05-10 NOTE — ED Provider Notes (Signed)
[facility] EMERGENCY DEPARTMENT Provider Note   CSN: [id number] Arrival date & time: 05/10/22  0038     History  Chief Complaint  Patient presents with   Sore Throat    Barry Henderson is a 32 y.o. male.  Patient is a 32 year old male presenting with complaints of sore throat.  This has been worsening over the past 24 hours.  He was recently diagnosed with a dental infection and was started on clindamycin on the sixth.  He describes it almost sounds like a globus sensation, but denies any difficulty breathing or swallowing.  He denies any fevers or chills.  The history is provided by the patient.       Home Medications Prior to Admission medications   Medication Sig Start Date End Date Taking? Authorizing Provider  dexamethasone (DECADRON) 4 MG tablet Take 1 tablet (4 mg total) by mouth 2 (two) times daily with a meal. Patient not taking: Reported on 04/12/2020 11/21/17   Lily Kocher, PA-C  EPINEPHrine 0.3 mg/0.3 mL IJ SOAJ injection Inject 0.3 mLs (0.3 mg total) into the muscle as needed for anaphylaxis. 12/22/19   Long, Wonda Olds, MD  erythromycin ophthalmic ointment Place a 1/2 inch ribbon of ointment into the lower right eyelid four times per day for the next 7 days Patient not taking: Reported on 04/12/2020 02/09/19   Petrucelli, Glynda Jaeger, PA-C  Multiple Vitamins-Minerals (EMERGEN-C IMMUNE PO) Take 1 tablet by mouth daily as needed (cold).    [provider]  risperiDONE (RISPERDAL) 0.5 MG tablet Take 1 tablet (0.5 mg total) by mouth 2 (two) times daily. 04/12/20 04/12/21  Connye Burkitt, NP      Allergies    Poison sumac extract    Review of Systems   Review of Systems  All other systems reviewed and are negative.   Physical Exam Updated Vital Signs BP 126/84 (BP Location: Right Arm)   Pulse (!) 56   Temp 97.9 F (36.6 C) (Oral)   Resp 18   Ht 5\' 6"  (1.676 m)   Wt 72.6 kg   SpO2 100%   BMI 25.82 kg/m  Physical Exam Vitals and nursing note  reviewed.  Constitutional:      General: He is not in acute distress.    Appearance: He is well-developed. He is not diaphoretic.  HENT:     Head: Normocephalic and atraumatic.     Mouth/Throat:     Mouth: Mucous membranes are moist.     Pharynx: No oropharyngeal exudate, posterior oropharyngeal erythema or uvula swelling.     Tonsils: No tonsillar exudate or tonsillar abscesses.  Cardiovascular:     Rate and Rhythm: Normal rate and regular rhythm.     Heart sounds: No murmur heard.    No friction rub.  Pulmonary:     Effort: Pulmonary effort is normal. No respiratory distress.     Breath sounds: Normal breath sounds. No wheezing or rales.  Abdominal:     General: Bowel sounds are normal. There is no distension.     Palpations: Abdomen is soft.     Tenderness: There is no abdominal tenderness.  Musculoskeletal:        General: Normal range of motion.     Cervical back: Normal range of motion and neck supple.  Skin:    General: Skin is warm and dry.  Neurological:     Mental Status: He is alert and oriented to person, place, and time.     Coordination: Coordination normal.  ED Results / Procedures / Treatments   Labs (all labs ordered are listed, but only abnormal results are displayed) Labs Reviewed  GROUP A STREP BY PCR    EKG None  Radiology No results found.  Procedures Procedures    Medications Ordered in ED Medications  predniSONE (DELTASONE) tablet 40 mg (has no administration in time range)    ED Course/ Medical Decision Making/ A&P  Patient presenting with complaints of throat pain as described in the HPI.  This started after his fourth day of clindamycin for a dental infection, so I doubt a bacterial etiology.  I also doubt neck abscess, Ludwig's, or epiglottitis.  Symptoms may be viral in nature so I will treat with prednisone.  Other possibilities are reflux possibly related to his clindamycin.  Patient reminded to take his medication with food.   To return as needed if symptoms worsen or change.  Final Clinical Impression(s) / ED Diagnoses Final diagnoses:  None    Rx / DC Orders ED Discharge Orders     None         Veryl Speak, MD 05/10/22 [phone]

## 2022-05-10 NOTE — ED Triage Notes (Signed)
Pt states feels like his throat is closing up x 3 days and c/o dry heaving. Pt in no distress, able to speak in complete sentences.  Pt states he is able to swallow. Pt just dropped prescription to pharmacy prior to arrival here, seen and discharged earlier this morning for same.

## 2022-05-10 NOTE — Discharge Instructions (Addendum)
Penicillin 3 times daily Finish the prednisone  ER for worsening symptoms.

## 2022-10-25 ENCOUNTER — Other Ambulatory Visit: Payer: Self-pay

## 2022-10-25 ENCOUNTER — Emergency Department (HOSPITAL_COMMUNITY)
Admission: EM | Admit: 2022-10-25 | Discharge: 2022-10-25 | Disposition: A | Discharge status: Home / Self Care | Payer: Self-pay | Attending: Emergency Medicine | Admitting: Emergency Medicine

## 2022-10-25 ENCOUNTER — Encounter (HOSPITAL_COMMUNITY): Payer: Self-pay | Admitting: Emergency Medicine

## 2022-10-25 DIAGNOSIS — R21 Rash and other nonspecific skin eruption: Secondary | ICD-10-CM | POA: Insufficient documentation

## 2022-10-25 MED ORDER — PREDNISONE 20 MG PO TABS: 40.0000 mg | ORAL_TABLET | Freq: Every day | ORAL | 0 refills | Status: AC

## 2022-10-25 NOTE — Discharge Instructions (Signed)
Pleasure taking care of you today.  You likely have a rash from contact with poison ivy, you are being given a prescription for steroids, you can take Zyrtec daily to help with the itching as well.  Follow-up with your primary care doctor if you do not have 1 1 can be chosen for the list below, come back to the ER if you have new or worsening symptoms, especially fever, hives, swelling or any other worrisome changes

## 2022-10-25 NOTE — ED Provider Notes (Signed)
[place] EMERGENCY DEPARTMENT AT [facility] Provider Note   CSN: [id number] Arrival date & time: 10/25/22  1109     History  Chief Complaint  Patient presents with   Rash    Barry Henderson is a 32 y.o. male.  He reports to the ER for complaint of a rash for the past 2 days, states he was outside with his daughter and thinks they got into some poison ivy area, she has a similar rash, has had poison ivy in the past and states that it was same.  He has been applying baking soda paste and states the rash is "flattened out" and now looks somewhat different.  He states last time he had this he was given prednisone.  He had to 10 mg tablets left over he took 1 last night and 1 this morning but is still itchy and is now out of prednisone and would like more.  No swelling of his face lips or tongue, no other new medications besides restarting the prednisone, no new cosmetics or detergents or soaps.   Rash      Home Medications Prior to Admission medications   Medication Sig Start Date End Date Taking? Authorizing Provider  dexamethasone (DECADRON) 4 MG tablet Take 1 tablet (4 mg total) by mouth 2 (two) times daily with a meal. Patient not taking: Reported on 04/12/2020 11/21/17   Ivery Quale, PA-C  EPINEPHrine 0.3 mg/0.3 mL IJ SOAJ injection Inject 0.3 mLs (0.3 mg total) into the muscle as needed for anaphylaxis. 12/22/19   Long, Arlyss Repress, MD  erythromycin ophthalmic ointment Place a 1/2 inch ribbon of ointment into the lower right eyelid four times per day for the next 7 days Patient not taking: Reported on 04/12/2020 02/09/19   Petrucelli, Pleas Koch, PA-C  Multiple Vitamins-Minerals (EMERGEN-C IMMUNE PO) Take 1 tablet by mouth daily as needed (cold).    [provider]  predniSONE (DELTASONE) 20 MG tablet Take 2 tablets (40 mg total) by mouth daily at 12 noon. 10/25/22   Carmel Sacramento A, PA-C  risperiDONE (RISPERDAL) 0.5 MG tablet Take 1 tablet (0.5 mg total) by  mouth 2 (two) times daily. 04/12/20 04/12/21  Aldean Baker, NP      Allergies    Poison sumac extract    Review of Systems   Review of Systems  Skin:  Positive for rash.    Physical Exam Updated Vital Signs BP (!) 134/93   Pulse 74   Temp 98.4 F (36.9 C) (Oral)   Resp 18   Ht 5\' 6"  (1.676 m)   Wt 79.4 kg   SpO2 100%   BMI 28.25 kg/m  Physical Exam Vitals and nursing note reviewed.  Constitutional:      General: He is not in acute distress.    Appearance: He is well-developed.  HENT:     Head: Normocephalic and atraumatic.  Eyes:     Conjunctiva/sclera: Conjunctivae normal.  Cardiovascular:     Rate and Rhythm: Normal rate and regular rhythm.     Heart sounds: No murmur heard. Pulmonary:     Effort: Pulmonary effort is normal. No respiratory distress.     Breath sounds: Normal breath sounds.  Abdominal:     Palpations: Abdomen is soft.     Tenderness: There is no abdominal tenderness.  Musculoskeletal:        General: No swelling.     Cervical back: Neck supple.  Skin:    General: Skin is warm  and dry.     Capillary Refill: Capillary refill takes less than 2 seconds.     Comments: No cellulitis, papular rash with excoriations to right forearm, dorsum of bilateral hands and abdomen  Neurological:     General: No focal deficit present.     Mental Status: He is alert and oriented to person, place, and time.  Psychiatric:        Mood and Affect: Mood normal.     ED Results / Procedures / Treatments   Labs (all labs ordered are listed, but only abnormal results are displayed) Labs Reviewed - No data to display  EKG None  Radiology No results found.  Procedures Procedures    Medications Ordered in ED Medications - No data to display  ED Course/ Medical Decision Making/ A&P                             Medical Decision Making Ddx: contact dermatitis, atopic dermatitis, viral exanthem, erythma migrans, other Course: Patient presents with itchy  rash, feels and looks like a prior poison ivy rash, no fevers or headache or other systemic symptoms.  No known tick bites, will represcribe prednisone which has helped in the past, advised on supportive care and follow-up and return precautions.  Risk Prescription drug management.           Final Clinical Impression(s) / ED Diagnoses Final diagnoses:  Rash    Rx / DC Orders ED Discharge Orders          Ordered    predniSONE (DELTASONE) 20 MG tablet  Daily        10/25/22 [address] PA-C 10/25/22 1325    Vanetta Mulders, MD 10/27/22 1405

## 2022-10-25 NOTE — ED Triage Notes (Signed)
Pt presents with several areas of rash since yesterday after he says his daughter got into a patch of poison ivy or poison sumac while playing outside.
